# Patient Record
Sex: Female | Born: 1955 | Race: White | Hispanic: No | Marital: Married | Smoking: Former smoker
Health system: Southern US, Community
[De-identification: ages and names within clinical notes are randomized; demographics above are authoritative.]

## PROBLEM LIST (undated history)

## (undated) DIAGNOSIS — K219 Gastro-esophageal reflux disease without esophagitis: Secondary | ICD-10-CM

## (undated) DIAGNOSIS — N3281 Overactive bladder: Secondary | ICD-10-CM

## (undated) DIAGNOSIS — Z9889 Other specified postprocedural states: Secondary | ICD-10-CM

## (undated) DIAGNOSIS — R51 Headache: Secondary | ICD-10-CM

## (undated) DIAGNOSIS — M66239 Spontaneous rupture of extensor tendons, unspecified forearm: Secondary | ICD-10-CM

## (undated) DIAGNOSIS — M66249 Spontaneous rupture of extensor tendons, unspecified hand: Secondary | ICD-10-CM

## (undated) DIAGNOSIS — M1990 Unspecified osteoarthritis, unspecified site: Secondary | ICD-10-CM

## (undated) DIAGNOSIS — D649 Anemia, unspecified: Secondary | ICD-10-CM

## (undated) DIAGNOSIS — Z98811 Dental restoration status: Secondary | ICD-10-CM

## (undated) DIAGNOSIS — J302 Other seasonal allergic rhinitis: Secondary | ICD-10-CM

## (undated) DIAGNOSIS — Z8489 Family history of other specified conditions: Secondary | ICD-10-CM

## (undated) DIAGNOSIS — Z87828 Personal history of other (healed) physical injury and trauma: Secondary | ICD-10-CM

## (undated) DIAGNOSIS — R112 Nausea with vomiting, unspecified: Secondary | ICD-10-CM

## (undated) HISTORY — PX: LAPAROSCOPIC ENDOMETRIOSIS FULGURATION: SUR769

---

## 1992-03-04 HISTORY — PX: ABDOMINAL HYSTERECTOMY: SHX81

## 1997-06-23 ENCOUNTER — Other Ambulatory Visit: Admission: RE | Admit: 1997-06-23 | Discharge: 1997-06-23 | Payer: Self-pay | Admitting: Gynecology

## 1998-07-06 ENCOUNTER — Other Ambulatory Visit: Admission: RE | Admit: 1998-07-06 | Discharge: 1998-07-06 | Payer: Self-pay | Admitting: Gynecology

## 1999-08-06 ENCOUNTER — Other Ambulatory Visit: Admission: RE | Admit: 1999-08-06 | Discharge: 1999-08-06 | Payer: Self-pay | Admitting: Gynecology

## 2000-09-02 ENCOUNTER — Other Ambulatory Visit: Admission: RE | Admit: 2000-09-02 | Discharge: 2000-09-02 | Payer: Self-pay | Admitting: Gynecology

## 2001-02-09 ENCOUNTER — Encounter: Admission: RE | Admit: 2001-02-09 | Discharge: 2001-02-09 | Payer: Self-pay | Admitting: Urology

## 2001-02-09 ENCOUNTER — Encounter: Payer: Self-pay | Admitting: Urology

## 2001-10-01 ENCOUNTER — Other Ambulatory Visit: Admission: RE | Admit: 2001-10-01 | Discharge: 2001-10-01 | Payer: Self-pay | Admitting: Gynecology

## 2002-09-27 ENCOUNTER — Other Ambulatory Visit: Admission: RE | Admit: 2002-09-27 | Discharge: 2002-09-27 | Payer: Self-pay | Admitting: Gynecology

## 2003-10-03 ENCOUNTER — Other Ambulatory Visit: Admission: RE | Admit: 2003-10-03 | Discharge: 2003-10-03 | Payer: Self-pay | Admitting: Gynecology

## 2004-10-10 ENCOUNTER — Other Ambulatory Visit: Admission: RE | Admit: 2004-10-10 | Discharge: 2004-10-10 | Payer: Self-pay | Admitting: Gynecology

## 2005-05-24 ENCOUNTER — Ambulatory Visit (HOSPITAL_BASED_OUTPATIENT_CLINIC_OR_DEPARTMENT_OTHER): Admission: RE | Admit: 2005-05-24 | Discharge: 2005-05-24 | Payer: Self-pay | Admitting: Orthopedic Surgery

## 2005-05-24 HISTORY — PX: KNEE ARTHROSCOPY: SUR90

## 2005-11-28 ENCOUNTER — Other Ambulatory Visit: Admission: RE | Admit: 2005-11-28 | Discharge: 2005-11-28 | Payer: Self-pay | Admitting: Gynecology

## 2007-03-02 ENCOUNTER — Other Ambulatory Visit: Admission: RE | Admit: 2007-03-02 | Discharge: 2007-03-02 | Payer: Self-pay | Admitting: Gynecology

## 2008-01-11 ENCOUNTER — Encounter: Admission: RE | Admit: 2008-01-11 | Discharge: 2008-01-11 | Payer: Self-pay | Admitting: Family Medicine

## 2009-03-04 HISTORY — PX: COLONOSCOPY: SHX174

## 2010-02-01 HISTORY — PX: EYE SURGERY: SHX253

## 2010-03-08 ENCOUNTER — Other Ambulatory Visit
Admission: RE | Admit: 2010-03-08 | Discharge: 2010-03-08 | Payer: Self-pay | Source: Home / Self Care | Admitting: Family Medicine

## 2010-07-20 NOTE — Op Note (Signed)
Kaitlyn House, Kaitlyn House              ACCOUNT NO.:  [id number]   MEDICAL RECORD NO.:  [id number]          PATIENT TYPE:  AMB   LOCATION:  DSC                          FACILITY:  [facility]   PHYSICIAN:  Harvie Junior, M.D.   DATE OF BIRTH:  Sep 25, 1955   DATE OF PROCEDURE:  05/24/2005  DATE OF DISCHARGE:                                 OPERATIVE REPORT   PREOPERATIVE DIAGNOSIS:  Medial meniscal tear.   POSTOPERATIVE DIAGNOSES:  1.  Medial meniscal tear.  2.  Chondromalacia patella.   PRINCIPAL PROCEDURE:  1.  Partial posteromedial meniscectomy with corresponding debridement of      medial femoral condyle.  2.  Debridement of chondromalacia patella.   SURGEON:  Harvie Junior, M.D.   ASSISTANT:  Marshia Ly, P.A.   ANESTHESIA:  General.   BRIEF HISTORY:  [name] is a 55 year old female with a long history of  having had medial-side knee pain.  We ultimately evaluated her.  MRI was  obtained which showed no significant meniscal pathology.  She was further  evaluated and felt to have a meniscal tear.  Injection therapy worked  briefly, but pain recurred.  Because of continuing complaints of pain and  failure of conservative care, she was ultimately taken to the operating room  for operative knee arthroscopy.   DESCRIPTION OF PROCEDURE:  The patient was taken to the operating room and  after adequate anesthesia was obtained with an general anesthetic, the  patient was placed supine on the operating table.  The right leg was then  prepped and draped in the usual sterile fashion and following this, routine  arthroscopic examination revealed there were some grade 2 and 3  chondromalacia of the patellofemoral joint, which was debrided.  Attention  was then turned to the medial compartment, where there was an obvious  posterior horn medial meniscal tear at the meniscal root; this was debrided  back to a smooth and stable rim.  Attention was then turned towards the  medial femoral  condyle, which had some grade 2 and 3 changes, which were  debrided.  Attention was turned to the ACL, normal, lateral side normal.  Patellar tracking was noted to be normal.  At this point, the knee was  copiously irrigated and suctioned dry.  A followup check was made for any  loose fragments of the PCL and there were none.  A sterile  compressive dressing was applied and the patient was taken to the recovery  room, where she was noted to be in satisfactory condition.   ESTIMATED BLOOD LOSS:  None.      Harvie Junior, M.D.  Electronically Signed     JLG/MEDQ  D:  05/24/2005  T:  05/27/2005  Job:  [id number]

## 2011-03-15 ENCOUNTER — Encounter (HOSPITAL_COMMUNITY): Payer: Self-pay | Admitting: Pharmacy Technician

## 2011-03-25 ENCOUNTER — Encounter (HOSPITAL_COMMUNITY): Payer: Self-pay

## 2011-03-25 ENCOUNTER — Encounter (HOSPITAL_COMMUNITY)
Admission: RE | Admit: 2011-03-25 | Discharge: 2011-03-25 | Disposition: A | Discharge status: Home / Self Care | Payer: BC Managed Care – PPO | Source: Ambulatory Visit | Attending: Orthopedic Surgery | Admitting: Orthopedic Surgery

## 2011-03-25 ENCOUNTER — Other Ambulatory Visit: Payer: Self-pay | Admitting: Orthopedic Surgery

## 2011-03-25 HISTORY — DX: Nausea with vomiting, unspecified: R11.2

## 2011-03-25 HISTORY — DX: Other specified postprocedural states: Z98.890

## 2011-03-25 HISTORY — DX: Anemia, unspecified: D64.9

## 2011-03-25 HISTORY — DX: Nausea with vomiting, unspecified: Z98.890

## 2011-03-25 LAB — HEPATIC FUNCTION PANEL
ALT: 13 U/L (ref 0–35)
Alkaline Phosphatase: 75 U/L (ref 39–117)
Bilirubin, Direct: <0.1 mg/dL (ref 0.0–0.3)
Total Protein: 7.4 g/dL (ref 6.0–8.3)

## 2011-03-25 LAB — CBC
HCT: 37.9 % (ref 36.0–46.0)
MCH: 25.5 pg — ABNORMAL LOW (ref 26.0–34.0)
MCV: 81.2 fL (ref 78.0–100.0)
Platelets: 281 10*3/uL (ref 150–400)
RBC: 4.67 MIL/uL (ref 3.87–5.11)
WBC: 10.2 10*3/uL (ref 4.0–10.5)

## 2011-03-25 LAB — DIFFERENTIAL
Eosinophils Relative: 4 % (ref 0–5)
Lymphocytes Relative: 23 % (ref 12–46)
Lymphs Abs: 2.8 10*3/uL (ref 0.7–4.0)
Monocytes Absolute: 0.8 10*3/uL (ref 0.1–1.0)
Neutro Abs: 7.8 10*3/uL — ABNORMAL HIGH (ref 1.7–7.7)
Neutrophils Relative %: 66 % (ref 43–77)

## 2011-03-25 LAB — BASIC METABOLIC PANEL
Calcium: 9.8 mg/dL (ref 8.4–10.5)
GFR calc Af Amer: >90 mL/min (ref >90)
GFR calc non Af Amer: >90 mL/min (ref >90)
Glucose, Bld: 89 mg/dL (ref 70–99)
Potassium: 4.4 mEq/L (ref 3.5–5.1)
Sodium: 140 mEq/L (ref 135–145)

## 2011-03-25 LAB — TYPE AND SCREEN
ABO/RH(D): O POS
Antibody Screen: NEGATIVE

## 2011-03-25 LAB — PROTIME-INR: INR: 0.96 (ref 0.00–1.49)

## 2011-03-25 LAB — ABO/RH: ABO/RH(D): O POS

## 2011-03-25 MED ORDER — SCOPOLAMINE 1 MG/3DAYS TD PT72: 1.0000 | MEDICATED_PATCH | TRANSDERMAL | Status: DC

## 2011-03-25 NOTE — Pre-Procedure Instructions (Signed)
20 BARB SHEAR  03/25/2011   Your procedure is scheduled on:  Mar 29, 2011 (Friday)  Report to [facility] at 0530 AM.  Call this number if you have problems the morning of surgery: [phone]   Remember:   Do not eat food:After Midnight.  May have clear liquids: up to 4 Hours before arrival. (1:30 am)  Clear liquids include soda, tea, black coffee, apple or grape juice, broth.  Take these medicines the morning of surgery with A SIP OF WATER: vicodin & phenergan as needed, nexium   Do not wear jewelry, make-up or nail polish.  Do not wear lotions, powders, or perfumes. You may wear deodorant.  Do not shave 48 hours prior to surgery.  Do not bring valuables to the hospital.  Contacts, dentures or bridgework may not be worn into surgery.  Leave suitcase in the car. After surgery it may be brought to your room.  For patients admitted to the hospital, checkout time is 11:00 AM the day of discharge.   Patients discharged the day of surgery will not be allowed to drive home.  Name and phone number of your driver: NA  Special Instructions: CHG Shower Use Special Wash: 1/2 bottle night before surgery and 1/2 bottle morning of surgery.   Please read over the following fact sheets that you were given: Pain Booklet, Total Joint Packet and Surgical Site Infection Prevention

## 2011-03-28 MED ORDER — POVIDONE-IODINE 7.5 % EX SOLN
Freq: Once | CUTANEOUS | Status: DC
  Filled 2011-03-28: qty 118

## 2011-03-28 MED ORDER — CEFAZOLIN SODIUM-DEXTROSE 2-3 GM-% IV SOLR
2.0000 g | INTRAVENOUS | Status: AC
  Administered 2011-03-29: 2 g via INTRAVENOUS
  Filled 2011-03-28: qty 50

## 2011-03-29 ENCOUNTER — Encounter (HOSPITAL_COMMUNITY): Payer: Self-pay | Admitting: *Deleted

## 2011-03-29 ENCOUNTER — Other Ambulatory Visit: Payer: Self-pay

## 2011-03-29 ENCOUNTER — Encounter (HOSPITAL_COMMUNITY): Payer: Self-pay | Admitting: Anesthesiology

## 2011-03-29 ENCOUNTER — Ambulatory Visit (HOSPITAL_COMMUNITY): Payer: BC Managed Care – PPO | Admitting: Anesthesiology

## 2011-03-29 ENCOUNTER — Encounter (HOSPITAL_COMMUNITY)
Admission: RE | Disposition: A | Discharge status: Home / Self Care | Payer: Self-pay | Source: Ambulatory Visit | Attending: Orthopedic Surgery

## 2011-03-29 ENCOUNTER — Ambulatory Visit (HOSPITAL_COMMUNITY): Payer: BC Managed Care – PPO

## 2011-03-29 ENCOUNTER — Inpatient Hospital Stay (HOSPITAL_COMMUNITY)
Admission: RE | Admit: 2011-03-29 | Discharge: 2011-04-01 | DRG: 209 | Disposition: A | Discharge status: Home / Self Care | Payer: BC Managed Care – PPO | Source: Ambulatory Visit | Attending: Orthopedic Surgery | Admitting: Orthopedic Surgery

## 2011-03-29 DIAGNOSIS — M1712 Unilateral primary osteoarthritis, left knee: Secondary | ICD-10-CM | POA: Diagnosis present

## 2011-03-29 DIAGNOSIS — Z01812 Encounter for preprocedural laboratory examination: Secondary | ICD-10-CM

## 2011-03-29 DIAGNOSIS — M1710 Unilateral primary osteoarthritis, unspecified knee: Principal | ICD-10-CM | POA: Diagnosis present

## 2011-03-29 DIAGNOSIS — Z87891 Personal history of nicotine dependence: Secondary | ICD-10-CM

## 2011-03-29 DIAGNOSIS — R51 Headache: Secondary | ICD-10-CM | POA: Diagnosis present

## 2011-03-29 DIAGNOSIS — Z90710 Acquired absence of both cervix and uterus: Secondary | ICD-10-CM

## 2011-03-29 DIAGNOSIS — K219 Gastro-esophageal reflux disease without esophagitis: Secondary | ICD-10-CM | POA: Diagnosis present

## 2011-03-29 HISTORY — PX: TOTAL KNEE ARTHROPLASTY: SHX125

## 2011-03-29 LAB — URINALYSIS, ROUTINE W REFLEX MICROSCOPIC
Bilirubin Urine: NEGATIVE
Nitrite: NEGATIVE
Protein, ur: NEGATIVE mg/dL
Specific Gravity, Urine: 1.024 (ref 1.005–1.030)
Urobilinogen, UA: 0.2 mg/dL (ref 0.0–1.0)
pH: 5 (ref 5.0–8.0)

## 2011-03-29 LAB — URINE MICROSCOPIC-ADD ON

## 2011-03-29 SURGERY — ARTHROPLASTY, KNEE, TOTAL
Anesthesia: Regional | Site: Knee | Laterality: Left | Wound class: Clean

## 2011-03-29 MED ORDER — FESOTERODINE FUMARATE ER 4 MG PO TB24: 4.0000 mg | ORAL_TABLET | Freq: Every day | ORAL | Status: DC

## 2011-03-29 MED ORDER — KETOROLAC TROMETHAMINE 30 MG/ML IJ SOLN
30.0000 mg | Freq: Three times a day (TID) | INTRAMUSCULAR | Status: AC
  Administered 2011-03-29 – 2011-03-30 (×3): 30 mg via INTRAVENOUS
  Filled 2011-03-29 (×2): qty 1

## 2011-03-29 MED ORDER — ALUM & MAG HYDROXIDE-SIMETH 200-200-20 MG/5ML PO SUSP: 30.0000 mL | ORAL | Status: DC | PRN

## 2011-03-29 MED ORDER — DIPHENHYDRAMINE HCL 12.5 MG/5ML PO ELIX
12.5000 mg | ORAL_SOLUTION | Freq: Four times a day (QID) | ORAL | Status: DC | PRN
  Filled 2011-03-29 (×2): qty 5

## 2011-03-29 MED ORDER — PANTOPRAZOLE SODIUM 40 MG PO TBEC
40.0000 mg | DELAYED_RELEASE_TABLET | Freq: Every day | ORAL | Status: DC
  Administered 2011-03-29 – 2011-04-01 (×4): 40 mg via ORAL
  Filled 2011-03-29 (×4): qty 1

## 2011-03-29 MED ORDER — DOCUSATE SODIUM 100 MG PO CAPS
100.0000 mg | ORAL_CAPSULE | Freq: Two times a day (BID) | ORAL | Status: DC
  Administered 2011-03-29 – 2011-04-01 (×7): 100 mg via ORAL
  Filled 2011-03-29 (×7): qty 1

## 2011-03-29 MED ORDER — DEXTROSE-NACL 5-0.45 % IV SOLN
INTRAVENOUS | Status: DC
  Administered 2011-03-29: 15:00:00 via INTRAVENOUS

## 2011-03-29 MED ORDER — ONDANSETRON HCL 4 MG PO TABS: 4.0000 mg | ORAL_TABLET | Freq: Four times a day (QID) | ORAL | Status: DC | PRN

## 2011-03-29 MED ORDER — MORPHINE SULFATE (PF) 1 MG/ML IV SOLN: INTRAVENOUS | Status: DC

## 2011-03-29 MED ORDER — DIPHENHYDRAMINE HCL 50 MG/ML IJ SOLN: 12.5000 mg | Freq: Four times a day (QID) | INTRAMUSCULAR | Status: DC | PRN

## 2011-03-29 MED ORDER — ROCURONIUM BROMIDE 100 MG/10ML IV SOLN
INTRAVENOUS | Status: DC | PRN
  Administered 2011-03-29: 50 mg via INTRAVENOUS

## 2011-03-29 MED ORDER — CEFAZOLIN SODIUM-DEXTROSE 2-3 GM-% IV SOLR
2.0000 g | Freq: Four times a day (QID) | INTRAVENOUS | Status: AC
  Administered 2011-03-29 – 2011-03-30 (×3): 2 g via INTRAVENOUS
  Filled 2011-03-29 (×4): qty 50

## 2011-03-29 MED ORDER — SCOPOLAMINE 1 MG/3DAYS TD PT72
1.0000 | MEDICATED_PATCH | TRANSDERMAL | Status: AC
  Administered 2011-03-29: 1.5 mg via TRANSDERMAL
  Filled 2011-03-29: qty 1

## 2011-03-29 MED ORDER — CEFUROXIME SODIUM 1.5 G IJ SOLR
INTRAMUSCULAR | Status: DC | PRN
  Administered 2011-03-29: 1.5 g

## 2011-03-29 MED ORDER — LIDOCAINE HCL (CARDIAC) 20 MG/ML IV SOLN
INTRAVENOUS | Status: DC | PRN
  Administered 2011-03-29: 70 mg via INTRAVENOUS

## 2011-03-29 MED ORDER — ONDANSETRON HCL 4 MG/2ML IJ SOLN: 4.0000 mg | Freq: Four times a day (QID) | INTRAMUSCULAR | Status: DC | PRN

## 2011-03-29 MED ORDER — SODIUM CHLORIDE 0.9 % IJ SOLN: 9.0000 mL | INTRAMUSCULAR | Status: DC | PRN

## 2011-03-29 MED ORDER — ACETAMINOPHEN 10 MG/ML IV SOLN
1000.0000 mg | Freq: Four times a day (QID) | INTRAVENOUS | Status: AC
  Administered 2011-03-29 – 2011-03-30 (×3): 1000 mg via INTRAVENOUS
  Filled 2011-03-29 (×4): qty 100

## 2011-03-29 MED ORDER — FENTANYL CITRATE 0.05 MG/ML IJ SOLN
INTRAMUSCULAR | Status: DC | PRN
  Administered 2011-03-29: 100 ug via INTRAVENOUS
  Administered 2011-03-29: 50 ug via INTRAVENOUS

## 2011-03-29 MED ORDER — PSEUDOEPHEDRINE HCL 60 MG PO TABS
60.0000 mg | ORAL_TABLET | Freq: Every day | ORAL | Status: DC
  Administered 2011-03-30 – 2011-04-01 (×3): 60 mg via ORAL
  Filled 2011-03-29 (×4): qty 1

## 2011-03-29 MED ORDER — METHOCARBAMOL 100 MG/ML IJ SOLN
500.0000 mg | Freq: Four times a day (QID) | INTRAMUSCULAR | Status: DC | PRN
  Filled 2011-03-29: qty 5

## 2011-03-29 MED ORDER — NEOSTIGMINE METHYLSULFATE 1 MG/ML IJ SOLN
INTRAMUSCULAR | Status: DC | PRN
  Administered 2011-03-29: 4 mg via INTRAVENOUS

## 2011-03-29 MED ORDER — PROMETHAZINE HCL 25 MG PO TABS
25.0000 mg | ORAL_TABLET | ORAL | Status: DC | PRN
  Administered 2011-03-31 – 2011-04-01 (×4): 25 mg via ORAL
  Filled 2011-03-29 (×4): qty 1

## 2011-03-29 MED ORDER — PROPOFOL 10 MG/ML IV EMUL
INTRAVENOUS | Status: DC | PRN
  Administered 2011-03-29: 160 mg via INTRAVENOUS

## 2011-03-29 MED ORDER — PSEUDOEPHEDRINE-GUAIFENESIN ER 60-600 MG PO TB12: 1.0000 | ORAL_TABLET | Freq: Every day | ORAL | Status: DC

## 2011-03-29 MED ORDER — PHENYLEPHRINE HCL 10 MG/ML IJ SOLN
10.0000 mg | INTRAVENOUS | Status: DC | PRN
  Administered 2011-03-29: 10 ug/min via INTRAVENOUS

## 2011-03-29 MED ORDER — PATIENT'S GUIDE TO USING COUMADIN BOOK
Freq: Once | Status: AC
  Administered 2011-03-29: 15:00:00
  Filled 2011-03-29: qty 1

## 2011-03-29 MED ORDER — PROMETHAZINE HCL 25 MG/ML IJ SOLN
12.5000 mg | INTRAMUSCULAR | Status: DC | PRN
  Administered 2011-03-29 – 2011-03-31 (×2): 12.5 mg via INTRAVENOUS
  Filled 2011-03-29 (×2): qty 1

## 2011-03-29 MED ORDER — ACETAMINOPHEN 10 MG/ML IV SOLN
INTRAVENOUS | Status: DC | PRN
  Administered 2011-03-29: 1000 mg via INTRAVENOUS

## 2011-03-29 MED ORDER — SCOPOLAMINE 1 MG/3DAYS TD PT72
1.0000 | MEDICATED_PATCH | TRANSDERMAL | Status: DC
  Filled 2011-03-29: qty 1

## 2011-03-29 MED ORDER — MORPHINE SULFATE (PF) 1 MG/ML IV SOLN
INTRAVENOUS | Status: DC
  Administered 2011-03-29: 23:00:00 via INTRAVENOUS
  Administered 2011-03-29: 19.5 mg via INTRAVENOUS
  Administered 2011-03-30 (×2): 3 mg via INTRAVENOUS
  Administered 2011-03-30: 1.5 mg via INTRAVENOUS
  Administered 2011-03-30: 5.2 mg via INTRAVENOUS
  Administered 2011-03-30: 1.5 mg via INTRAVENOUS
  Administered 2011-03-31: 6 mg via INTRAVENOUS
  Administered 2011-03-31: via INTRAVENOUS
  Administered 2011-03-31: 1.5 mg via INTRAVENOUS
  Filled 2011-03-29 (×2): qty 25

## 2011-03-29 MED ORDER — ACETAMINOPHEN 10 MG/ML IV SOLN
INTRAVENOUS | Status: AC
  Filled 2011-03-29: qty 100

## 2011-03-29 MED ORDER — BUPIVACAINE-EPINEPHRINE PF 0.5-1:200000 % IJ SOLN
INTRAMUSCULAR | Status: DC | PRN
  Administered 2011-03-29: 30 mL

## 2011-03-29 MED ORDER — HYDROMORPHONE HCL PF 1 MG/ML IJ SOLN
0.2500 mg | INTRAMUSCULAR | Status: DC | PRN
  Administered 2011-03-29 (×2): 0.5 mg via INTRAVENOUS

## 2011-03-29 MED ORDER — MORPHINE SULFATE (PF) 1 MG/ML IV SOLN
INTRAVENOUS | Status: DC
  Administered 2011-03-29: 11:00:00 via INTRAVENOUS
  Filled 2011-03-29: qty 25

## 2011-03-29 MED ORDER — METHOCARBAMOL 500 MG PO TABS
500.0000 mg | ORAL_TABLET | Freq: Four times a day (QID) | ORAL | Status: DC | PRN
  Administered 2011-03-30 – 2011-03-31 (×2): 500 mg via ORAL
  Filled 2011-03-29 (×2): qty 1

## 2011-03-29 MED ORDER — ACETAMINOPHEN 650 MG RE SUPP: 650.0000 mg | Freq: Four times a day (QID) | RECTAL | Status: DC | PRN

## 2011-03-29 MED ORDER — MIDAZOLAM HCL 5 MG/5ML IJ SOLN
INTRAMUSCULAR | Status: DC | PRN
  Administered 2011-03-29 (×2): 1 mg via INTRAVENOUS

## 2011-03-29 MED ORDER — OXYCODONE HCL 5 MG PO TABS
5.0000 mg | ORAL_TABLET | ORAL | Status: DC | PRN
  Administered 2011-03-31 – 2011-04-01 (×7): 10 mg via ORAL
  Filled 2011-03-29 (×7): qty 2

## 2011-03-29 MED ORDER — ACETAMINOPHEN 325 MG PO TABS: 650.0000 mg | ORAL_TABLET | Freq: Four times a day (QID) | ORAL | Status: DC | PRN

## 2011-03-29 MED ORDER — GLYCOPYRROLATE 0.2 MG/ML IJ SOLN
INTRAMUSCULAR | Status: DC | PRN
  Administered 2011-03-29: .6 mg via INTRAVENOUS

## 2011-03-29 MED ORDER — ONDANSETRON HCL 4 MG/2ML IJ SOLN
INTRAMUSCULAR | Status: DC | PRN
  Administered 2011-03-29 (×2): 4 mg via INTRAVENOUS

## 2011-03-29 MED ORDER — ZOLPIDEM TARTRATE 5 MG PO TABS: 5.0000 mg | ORAL_TABLET | Freq: Every evening | ORAL | Status: DC | PRN

## 2011-03-29 MED ORDER — METHOCARBAMOL 100 MG/ML IJ SOLN
500.0000 mg | INTRAVENOUS | Status: AC
  Administered 2011-03-29: 500 mg via INTRAVENOUS
  Filled 2011-03-29: qty 5

## 2011-03-29 MED ORDER — KETOROLAC TROMETHAMINE 30 MG/ML IJ SOLN
INTRAMUSCULAR | Status: AC
  Administered 2011-03-30: 30 mg via INTRAVENOUS
  Filled 2011-03-29: qty 1

## 2011-03-29 MED ORDER — POLYSACCHARIDE IRON 150 MG PO CAPS
150.0000 mg | ORAL_CAPSULE | Freq: Every day | ORAL | Status: DC
  Administered 2011-03-29 – 2011-03-31 (×3): 150 mg via ORAL
  Filled 2011-03-29 (×4): qty 1

## 2011-03-29 MED ORDER — DEXAMETHASONE SODIUM PHOSPHATE 4 MG/ML IJ SOLN
INTRAMUSCULAR | Status: DC | PRN
  Administered 2011-03-29: 4 mg via INTRAVENOUS

## 2011-03-29 MED ORDER — FESOTERODINE FUMARATE ER 4 MG PO TB24
4.0000 mg | ORAL_TABLET | Freq: Every day | ORAL | Status: DC
  Administered 2011-03-29 – 2011-03-31 (×3): 4 mg via ORAL
  Filled 2011-03-29 (×4): qty 1

## 2011-03-29 MED ORDER — WARFARIN SODIUM 5 MG PO TABS
5.0000 mg | ORAL_TABLET | Freq: Once | ORAL | Status: AC
  Administered 2011-03-29: 5 mg via ORAL
  Filled 2011-03-29: qty 1

## 2011-03-29 MED ORDER — WARFARIN VIDEO: Freq: Once | Status: DC

## 2011-03-29 MED ORDER — LACTATED RINGERS IV SOLN
INTRAVENOUS | Status: DC | PRN
  Administered 2011-03-29 (×2): via INTRAVENOUS

## 2011-03-29 MED ORDER — NALOXONE HCL 0.4 MG/ML IJ SOLN: 0.4000 mg | INTRAMUSCULAR | Status: DC | PRN

## 2011-03-29 MED ORDER — GUAIFENESIN ER 600 MG PO TB12
600.0000 mg | ORAL_TABLET | Freq: Every day | ORAL | Status: DC
  Administered 2011-03-30 – 2011-04-01 (×3): 600 mg via ORAL
  Filled 2011-03-29 (×4): qty 1

## 2011-03-29 MED ORDER — PROPOFOL 10 MG/ML IV EMUL
INTRAVENOUS | Status: DC | PRN
  Administered 2011-03-29: 10:00:00 via INTRAVENOUS
  Administered 2011-03-29: 140 ug/kg/min via INTRAVENOUS
  Administered 2011-03-29: 09:00:00 via INTRAVENOUS

## 2011-03-29 MED ORDER — SODIUM CHLORIDE 0.9 % IR SOLN
Status: DC | PRN
  Administered 2011-03-29: 1000 mL

## 2011-03-29 SURGICAL SUPPLY — 72 items
BANDAGE ESMARK 6X9 LF (GAUZE/BANDAGES/DRESSINGS) ×1 IMPLANT
BENZOIN TINCTURE PRP APPL 2/3 (GAUZE/BANDAGES/DRESSINGS) IMPLANT
BLADE SAGITTAL 25.0X1.19X90 (BLADE) ×2 IMPLANT
BLADE SAW SAG 90X13X1.27 (BLADE) ×2 IMPLANT
BNDG ESMARK 6X9 LF (GAUZE/BANDAGES/DRESSINGS) ×2
BOWL SMART MIX CTS (DISPOSABLE) ×2 IMPLANT
CEMENT HV SMART SET (Cement) ×4 IMPLANT
CLOSURE STERI STRIP 1/2 X4 (GAUZE/BANDAGES/DRESSINGS) ×2 IMPLANT
CLOTH BEACON ORANGE TIMEOUT ST (SAFETY) ×2 IMPLANT
COVER BACK TABLE 24X17X13 BIG (DRAPES) IMPLANT
COVER SURGICAL LIGHT HANDLE (MISCELLANEOUS) ×2 IMPLANT
CUFF TOURNIQUET SINGLE 34IN LL (TOURNIQUET CUFF) ×2 IMPLANT
CUFF TOURNIQUET SINGLE 44IN (TOURNIQUET CUFF) IMPLANT
DRAPE EXTREMITY T 121X128X90 (DRAPE) ×2 IMPLANT
DRAPE U-SHAPE 47X51 STRL (DRAPES) ×2 IMPLANT
DRSG PAD ABDOMINAL 8X10 ST (GAUZE/BANDAGES/DRESSINGS) ×2 IMPLANT
DURAPREP 26ML APPLICATOR (WOUND CARE) ×2 IMPLANT
ELECT REM PT RETURN 9FT ADLT (ELECTROSURGICAL) ×2
ELECTRODE REM PT RTRN 9FT ADLT (ELECTROSURGICAL) ×1 IMPLANT
EVACUATOR 1/8 PVC DRAIN (DRAIN) ×2 IMPLANT
FACESHIELD LNG OPTICON STERILE (SAFETY) ×2 IMPLANT
GAUZE XEROFORM 1X8 LF (GAUZE/BANDAGES/DRESSINGS) ×2 IMPLANT
GAUZE XEROFORM 5X9 LF (GAUZE/BANDAGES/DRESSINGS) IMPLANT
GLOVE BIOGEL PI IND STRL 7.0 (GLOVE) ×1 IMPLANT
GLOVE BIOGEL PI IND STRL 7.5 (GLOVE) ×1 IMPLANT
GLOVE BIOGEL PI IND STRL 8 (GLOVE) ×2 IMPLANT
GLOVE BIOGEL PI INDICATOR 7.0 (GLOVE) ×1
GLOVE BIOGEL PI INDICATOR 7.5 (GLOVE) ×1
GLOVE BIOGEL PI INDICATOR 8 (GLOVE) ×2
GLOVE ECLIPSE 7.5 STRL STRAW (GLOVE) ×6 IMPLANT
GLOVE SURG ORTHO 7.0 STRL STRW (GLOVE) ×2 IMPLANT
GLOVE SURG SS PI 6.5 STRL IVOR (GLOVE) ×2 IMPLANT
GOWN PREVENTION PLUS XLARGE (GOWN DISPOSABLE) ×4 IMPLANT
GOWN SRG XL XLNG 56XLVL 4 (GOWN DISPOSABLE) ×1 IMPLANT
GOWN STRL NON-REIN LRG LVL3 (GOWN DISPOSABLE) ×4 IMPLANT
GOWN STRL NON-REIN XL XLG LVL4 (GOWN DISPOSABLE) ×1
HANDPIECE INTERPULSE COAX TIP (DISPOSABLE) ×1
HOOD PEEL AWAY FACE SHEILD DIS (HOOD) ×6 IMPLANT
IMMOBILIZER KNEE 20 (SOFTGOODS)
IMMOBILIZER KNEE 20 THIGH 36 (SOFTGOODS) IMPLANT
IMMOBILIZER KNEE 22 UNIV (SOFTGOODS) ×2 IMPLANT
IMMOBILIZER KNEE 24 THIGH 36 (MISCELLANEOUS) IMPLANT
IMMOBILIZER KNEE 24 UNIV (MISCELLANEOUS)
KIT BASIN OR (CUSTOM PROCEDURE TRAY) ×2 IMPLANT
KIT ROOM TURNOVER OR (KITS) ×2 IMPLANT
MANIFOLD NEPTUNE II (INSTRUMENTS) ×2 IMPLANT
MARKER SPHERE PSV REFLC THRD 5 (MARKER) ×6 IMPLANT
NEEDLE HYPO 25GX1X1/2 BEV (NEEDLE) IMPLANT
NS IRRIG 1000ML POUR BTL (IV SOLUTION) ×2 IMPLANT
PACK TOTAL JOINT (CUSTOM PROCEDURE TRAY) ×2 IMPLANT
PAD ARMBOARD 7.5X6 YLW CONV (MISCELLANEOUS) ×2 IMPLANT
PAD CAST 4YDX4 CTTN HI CHSV (CAST SUPPLIES) IMPLANT
PADDING CAST COTTON 4X4 STRL (CAST SUPPLIES)
PADDING CAST COTTON 6X4 STRL (CAST SUPPLIES) ×2 IMPLANT
PIN SCHANZ 4MM 130MM (PIN) ×8 IMPLANT
SET HNDPC FAN SPRY TIP SCT (DISPOSABLE) ×1 IMPLANT
SPONGE GAUZE 4X4 12PLY (GAUZE/BANDAGES/DRESSINGS) IMPLANT
STAPLER VISISTAT 35W (STAPLE) ×2 IMPLANT
STRIP CLOSURE SKIN 1/2X4 (GAUZE/BANDAGES/DRESSINGS) IMPLANT
SUCTION FRAZIER TIP 10 FR DISP (SUCTIONS) ×2 IMPLANT
SUT ETHIBOND 2 OS 4 DA (SUTURE) ×2 IMPLANT
SUT MON AB 3-0 SH 27 (SUTURE) ×3
SUT MON AB 3-0 SH27 (SUTURE) ×3 IMPLANT
SUT VIC AB 0 CTB1 27 (SUTURE) ×4 IMPLANT
SUT VIC AB 1 CT1 27 (SUTURE) ×4
SUT VIC AB 1 CT1 27XBRD ANBCTR (SUTURE) ×4 IMPLANT
SUT VIC AB 2-0 CTB1 (SUTURE) ×4 IMPLANT
SYR CONTROL 10ML LL (SYRINGE) IMPLANT
TOWEL OR 17X24 6PK STRL BLUE (TOWEL DISPOSABLE) ×2 IMPLANT
TOWEL OR 17X26 10 PK STRL BLUE (TOWEL DISPOSABLE) ×2 IMPLANT
TRAY FOLEY CATH 14FR (SET/KITS/TRAYS/PACK) ×2 IMPLANT
WATER STERILE IRR 1000ML POUR (IV SOLUTION) ×4 IMPLANT

## 2011-03-29 NOTE — Anesthesia Procedure Notes (Addendum)
Anesthesia Regional Block:  Femoral nerve block  Pre-Anesthetic Checklist: ,, timeout performed, Correct Patient, Correct Site, Correct Laterality, Correct Procedure,, site marked, risks and benefits discussed, Surgical consent,  Pre-op evaluation,  At surgeon's request and post-op pain management  Laterality: Left  Prep: chloraprep       Needles:  Injection technique: Single-shot  Needle Type: Echogenic Stimulator Needle     Needle Length: 9cm  Needle Gauge: 21    Additional Needles:  Procedures: nerve stimulator Femoral nerve block  Nerve Stimulator or Paresthesia:  Response: Quadriceps muscle contraction, 0.45 mA,   Additional Responses:   Narrative:  Start time: 03/29/2011 7:10 AM End time: 03/29/2011 7:20 AM Injection made incrementally with aspirations every 5 mL.  Performed by: Personally  Anesthesiologist: Dr Chaney Malling  Additional Notes: Functioning IV was confirmed and monitors were applied.  A 90mm 21ga Arrow echogenic stimulator needle was used. Sterile prep and drape,hand hygiene and sterile gloves were used.  Negative aspiration and negative test dose prior to incremental administration of local anesthetic. The patient tolerated the procedure well.    Femoral nerve block Procedure Name: Intubation Date/Time: 03/29/2011 8:49 AM Performed by: Darcey Nora Pre-anesthesia Checklist: Patient identified, Emergency Drugs available, Suction available and Patient being monitored Patient Re-evaluated:Patient Re-evaluated prior to inductionOxygen Delivery Method: Circle System Utilized Preoxygenation: Pre-oxygenation with 100% oxygen Intubation Type: IV induction Ventilation: Mask ventilation without difficulty Laryngoscope Size: Mac and 3 Grade View: Grade II Tube type: Oral Tube size: 7.5 mm Number of attempts: 1 Airway Equipment and Method: stylet Secured at: 22 cm Tube secured with: Tape Dental Injury: Teeth and Oropharynx as per pre-operative assessment

## 2011-03-29 NOTE — Progress Notes (Signed)
Called Dr. Jodi Geralds to report that Patient says her surgery should be on the left knee instead of the right. Dr. Luiz Blare stated to change it to the left knee.

## 2011-03-29 NOTE — Transfer of Care (Signed)
Immediate Anesthesia Transfer of Care Note  Patient: Kaitlyn House  Procedure(s) Performed:  TOTAL KNEE ARTHROPLASTY  Patient Location: PACU  Anesthesia Type: General  Level of Consciousness: awake, sedated and patient cooperative  Airway & Oxygen Therapy: Patient Spontanous Breathing and Patient connected to nasal cannula oxygen  Post-op Assessment: Report given to PACU RN, Post -op Vital signs reviewed and stable and Patient moving all extremities  Post vital signs: Reviewed and stable  Complications: No apparent anesthesia complications

## 2011-03-29 NOTE — Brief Op Note (Cosign Needed)
03/29/2011  10:26 AM  PATIENT:  Kaitlyn House  56 y.o. female  PRE-OPERATIVE DIAGNOSIS:  Degenerative Joint Disease Left Knee  POST-OPERATIVE DIAGNOSIS:  Degenerative Joint Disease Left Knee  PROCEDURE:  Procedure(s):Left TOTAL KNEE ARTHROPLASTY Computer Assisted  SURGEON:  Surgeon(s): Harvie Junior, MD  PHYSICIAN ASSISTANT: Susanna Benge PA-C  ANESTHESIA:   general  EBL:  Total I/O In: 1000 [I.V.:1000] Out: 600 [Urine:475; Blood:125]  BLOOD ADMINISTERED:none  DRAINS: Hemovac x 1  COUNTS:  YES  TOURNIQUET:   Total Tourniquet Time Documented: Thigh (Left) - 85 minutes  DICTATION: .#[id number]  PLAN OF CARE: Admit to inpatient   PATIENT DISPOSITION:  PACU - hemodynamically stable.   Gus Puma PA-C

## 2011-03-29 NOTE — Anesthesia Postprocedure Evaluation (Signed)
Anesthesia Post Note  Patient: Kaitlyn House  Procedure(s) Performed:  TOTAL KNEE ARTHROPLASTY  Anesthesia type: General  Patient location: PACU  Post pain: Pain level controlled and Adequate analgesia  Post assessment: Post-op Vital signs reviewed, Patient's Cardiovascular Status Stable, Respiratory Function Stable, Patent Airway and Pain level controlled  Last Vitals:  Filed Vitals:   03/29/11 1119  BP:   Pulse:   Temp:   Resp: 17    Post vital signs: Reviewed and stable  Level of consciousness: awake, alert  and oriented  Complications: No apparent anesthesia complications

## 2011-03-29 NOTE — Preoperative (Signed)
Beta Blockers   Reason not to administer Beta Blockers:Not Applicable 

## 2011-03-29 NOTE — Op Note (Signed)
Kaitlyn House, Kaitlyn House              ACCOUNT NO.:  [id number]  MEDICAL RECORD NO.:  [id number]  LOCATION:  [facility]                         FACILITY:  [facility]  PHYSICIAN:  Harvie Junior, M.D.   DATE OF BIRTH:  08/20/1955  DATE OF PROCEDURE:  03/29/2011 DATE OF DISCHARGE:                              OPERATIVE REPORT   PREOPERATIVE DIAGNOSIS:  End-stage degenerative joint disease, bilateral knee.  POSTOPERATIVE DIAGNOSIS:  End-stage degenerative joint disease, bilateral knee.  PROCEDURE: 1. Left total knee replacement with a Sigma system, size 4 narrow     femur, size 3 MBT revision tray, 15 mm bridging bearing, and a 35     mm all-polyethylene patella. 2. Computer-assisted left total knee replacement.  SURGEON:  Harvie Junior, MD  ASSISTANT:  Marshia Ly, PA  ANESTHESIA:  General.  BRIEF HISTORY:  Kaitlyn House is a 56 year old female with a long history of severe bilateral degenerative joint disease of the knee, which was treated conservatively for a prolonged period of time with activity modification, injection, therapy, anti-inflammatory medication, and use of a cane.  She has failed all of these.  She had bone-on-bone changes on her x-ray.  We talked about treatment options.  I also felt that a total knee replacement is most appropriate one.  Because of her young age and large size, I felt the computer assistance will be appropriate and also felt that an MBT revision tray would be appropriate given being a female.  The patient was taken to the operating room for this procedure.  PROCEDURE IN DETAIL:  The patient was taken to the operating room. After adequate level of anesthesia was obtained with general anesthetic, the patient was placed supine on the operating table.  The left leg was prepped and draped in the usual sterile fashion.  Following this, the leg was exsanguinated and blood pressure tourniquet was inflated to 350 mmHg.  Following this, a midline incision was  made in the subcutaneous tissue down to the level of extensor mechanism.  A medial parapatellar arthrotomy was undertaken.  Following this, the knee was exposed, medial and lateral meniscus were removed as well as retropatellar fat pad, anterior and posterior cruciates, and synovium in the anterior aspect of the femur.  Following this, the computer modules were placed, 2 pins in the tibia, 2 pins in the femur, and the registration process was undertaken.  This has 30 minutes of surgical procedure.  Once this was completed, attention then turned to the tibia, which was cut perpendicular to its long axis.  Attention then turned to the femur, cut perpendicular to the anatomic axis.  Spacer blocks were put in place. At this point, 15 appeared to be the gap and she had perfect neutral long alignment at this point.  Attention then turned to the femur, sized to a 4.  Anterior and posterior cuts were made as well as Chamfers and box.  Attention then turned to the tibia, sized to a 3.  MBT revision tray was chosen because of her large size.  Once the femoral trial was put in place, we went to a 4 narrow femur, she has had a better fit and went to a 15-mm  poly trial and this gave by computer assistance perfect neutral long alignment and gap balance.  Attention then turned to the patella, cut down to the level of 13 mm and a 35 paddle was chosen and then the lugs were drilled for the patella and lugs were drilled in the femur.  Attention then turned back to the knee where the trial components were removed and the final components were cemented into place.  Size 4 narrow femur, size 3, and MBT revision tray and this was after the femur was thoroughly washed and dried.  The 50 mm bridging bearing was placed trial and then the 35 all-poly patella and all cement was allowed to harden.  All excess bone cement was removed.  Once this was completed, the tourniquet was let down.  We trial a 12.5-poly,  which appeared to be a little bit too loose in extension and to loose in flexion, so we went back to the 15 and put the final 15 in.  A medium Hemovac drain was placed.  During the procedure, it did appear that the popliteal tendon was ruptured at some point and this was repaired with a #2 Ethibond interrupted suture.  The final 15 poly was put in place, medium Hemovac drain placed, and the medial parapatellar arthrotomy was closed with a 1 Vicryl running.  All bleedings were controlled with electrocautery prior to this.  At this point, the skin was closed with 0 and 2-0 Vicryl and 3-0 Monocryl subcuticular.  Benzoin and Steri-Strips were applied.  Sterile compressive dressing was applied.  The patient was taken to the recovery room where she was noted to be in satisfactory condition.  Estimated blood loss for the procedure was none.     Harvie Junior, M.D.     Ranae Plumber  D:  03/29/2011  T:  03/29/2011  Job:  [id number]

## 2011-03-29 NOTE — Anesthesia Preprocedure Evaluation (Addendum)
Anesthesia Evaluation  Patient identified by MRN, date of birth, ID band Patient awake    Reviewed: Allergy & Precautions, H&P , NPO status , Patient's Chart, lab work & pertinent test results, reviewed documented beta blocker date and time   History of Anesthesia Complications (+) PONV and POST - OP SPINAL HEADACHE  Airway Mallampati: II TM Distance: >3 FB Neck ROM: full    Dental  (+) Teeth Intact and Dental Advisory Given   Pulmonary          Cardiovascular     Neuro/Psych  Headaches,    GI/Hepatic GERD-  Medicated and Controlled,  Endo/Other  Morbid obesity  Renal/GU      Musculoskeletal   Abdominal   Peds  Hematology   Anesthesia Other Findings   Reproductive/Obstetrics                         Anesthesia Physical Anesthesia Plan  ASA: II  Anesthesia Plan: General   Post-op Pain Management: MAC Combined w/ Regional for Post-op pain   Induction: Intravenous  Airway Management Planned: Oral ETT  Additional Equipment:   Intra-op Plan:   Post-operative Plan: Extubation in OR  Informed Consent: I have reviewed the patients History and Physical, chart, labs and discussed the procedure including the risks, benefits and alternatives for the proposed anesthesia with the patient or authorized representative who has indicated his/her understanding and acceptance.     Plan Discussed with: CRNA and Surgeon  Anesthesia Plan Comments:         Anesthesia Quick Evaluation

## 2011-03-29 NOTE — Progress Notes (Signed)
ANTICOAGULATION CONSULT NOTE - Initial Consult  Pharmacy Consult for warfarin Indication: VTE prophylaxis  No Known Allergies  Patient Measurements:   Heparin Dosing Weight:   Vital Signs: Temp: 97.9 F (36.6 C) (01/25 1232) Temp src: Oral (01/25 0613) BP: 108/53 mmHg (01/25 1245) Pulse Rate: 63  (01/25 1232)  Labs: No results found for this basename: HGB:2,HCT:3,PLT:3,APTT:3,LABPROT:3,INR:3,HEPARINUNFRC:3,CREATININE:3,CKTOTAL:3,CKMB:3,TROPONINI:3 in the last 72 hours CrCl is unknown because there is no height on file for the current visit.  Medical History: Past Medical History  Diagnosis Date  . PONV (postoperative nausea and vomiting)   . Spinal headache   . Anemia     tx with iron    Medications:  Scheduled:    .  ceFAZolin (ANCEF) IV  2 g Intravenous 60 min Pre-Op  . ketorolac  30 mg Intravenous Q8H  . ketorolac      . methocarbamol(ROBAXIN) IV  500 mg Intravenous To PACU  . scopolamine  1 patch Transdermal To 282  . DISCONTD: morphine   Intravenous Q4H  . DISCONTD: povidone-iodine   Topical Once   Infusions:    Assessment: 56 yo female s/p total knee arthroplasty will be started on coumadin therapy.  INR on 03/25/11 was 0.96.  Coumadin score = 5 Goal of Therapy:  INR 1.5-2   Plan:  1) Coumadin 5mg  po x1 2) INR in am  Jacqualyn Sedgwick, Tsz-Yin 03/29/2011,1:37 PM

## 2011-03-29 NOTE — H&P (Signed)
PREOPERATIVE H&P  Chief Complaint: bilat kneee pain  HPI: Kaitlyn House is a 56 y.o. female who presents for evaluation of bilat knee pain L>R. It has been present for greater than 1 year and has been worsening.  She has bone on bone changes on x-rayr She has failed conservative measures. Pain is rated as severe.  Past Medical History  Diagnosis Date  . PONV (postoperative nausea and vomiting)   . Spinal headache   . Anemia     tx with iron   Past Surgical History  Procedure Date  . Abdominal hysterectomy 1994  . Knee arthroscopy   . Eye surgery 02/2010    lasik  bilateral  . Colonoscopy 2011  . Laparoscopic endometriosis fulguration    History   Social History  . Marital Status: Married    Spouse Name: N/A    Number of Children: N/A  . Years of Education: N/A   Social History Main Topics  . Smoking status: Former Smoker    Quit date: 03/04/1988  . Smokeless tobacco: None  . Alcohol Use: Yes  . Drug Use: Yes    Special: Hydrocodone  . Sexually Active:    Other Topics Concern  . None   Social History Narrative  . None   History reviewed. No pertinent family history. No Known Allergies Prior to Admission medications   Medication Sig Start Date End Date Taking? Authorizing Provider  Ascorbic Acid (VITAMIN C) 1000 MG tablet Take 1,000 mg by mouth at bedtime.   Yes Historical Provider, MD  aspirin-acetaminophen-caffeine (EXCEDRIN MIGRAINE) [phone] MG per tablet Take 2 tablets by mouth every 6 (six) hours as needed. migraines   Yes Historical Provider, MD  Calcium Carbonate-Vit D-Min (CALCIUM 600+D PLUS MINERALS) 600-400 MG-UNIT TABS Take 2 tablets by mouth every morning.   Yes Historical Provider, MD  diphenhydrAMINE (BENADRYL) 25 MG tablet Take 25-50 mg by mouth at bedtime as needed. Congestion   Yes Historical Provider, MD  esomeprazole (NEXIUM) 20 MG capsule Take 20 mg by mouth daily as needed. Acid reflux   Yes Historical Provider, MD  fesoterodine (TOVIAZ)  4 MG TB24 Take 4 mg by mouth at bedtime.   Yes Historical Provider, MD  HYDROcodone-acetaminophen (VICODIN) 5-500 MG per tablet Take 1 tablet by mouth every 6 (six) hours as needed. For pain   Yes Historical Provider, MD  ibuprofen (ADVIL,MOTRIN) 200 MG tablet Take 400-600 mg by mouth every 6 (six) hours as needed. For pain   Yes Historical Provider, MD  polysaccharide iron (NIFEREX) 150 MG CAPS capsule Take 150 mg by mouth at bedtime.   Yes Historical Provider, MD  promethazine (PHENERGAN) 25 MG tablet Take 25 mg by mouth every 6 (six) hours as needed. For nausea with hydrocodone   Yes Historical Provider, MD  pseudoephedrine-guaifenesin (MUCINEX D) 60-600 MG per tablet Take 1 tablet by mouth daily after breakfast. Congestion   Yes Historical Provider, MD  scopolamine (TRANSDERM-SCOP) 1.5 MG Place 1 patch (1.5 mg total) onto the skin every 3 (three) days. 03/25/11 03/24/12  Harvie Junior, MD     Positive ROS: none  All other systems have been reviewed and were otherwise negative with the exception of those mentioned in the HPI and as above.  Physical Exam: Filed Vitals:   03/29/11 0613  BP: 138/91  Pulse: 80  Temp: 98 F (36.7 C)  Resp: 20    General: Alert, no acute distress Cardiovascular: No pedal edema Respiratory: No cyanosis, no use of accessory musculature  GI: No organomegaly, abdomen is soft and non-tender Skin: No lesions in the area of chief complaint Neurologic: Sensation intact distally Psychiatric: Patient is competent for consent with normal mood and affect Lymphatic: No axillary or cervical lymphadenopathy  MUSCULOSKELETAL: l. Knee pain on rom.  +ttp over med side skin intact.    Assessment/Plan: degenerative joint disease Plan for Procedure(s): TOTAL KNEE ARTHROPLASTY Computer assisted knee arthroplasty  The risks benefits and alternatives were discussed with the patient including but not limited to the risks of nonoperative treatment, versus surgical  intervention including infection, bleeding, nerve injury, malunion, nonunion, hardware prominence, hardware failure, need for hardware removal, blood clots, cardiopulmonary complications, morbidity, mortality, among others, and they were willing to proceed.  Predicted outcome is good, although there will be at least a six to nine month expected recovery.  Kaitlyn House L, MD 03/29/2011 7:24 AM

## 2011-03-30 LAB — CBC
HCT: 29.9 % — ABNORMAL LOW (ref 36.0–46.0)
MCH: 26.5 pg (ref 26.0–34.0)
MCV: 81.7 fL (ref 78.0–100.0)
Platelets: 245 10*3/uL (ref 150–400)
RBC: 3.66 MIL/uL — ABNORMAL LOW (ref 3.87–5.11)

## 2011-03-30 LAB — BASIC METABOLIC PANEL
GFR calc Af Amer: >90 mL/min (ref >90)
GFR calc non Af Amer: >90 mL/min (ref >90)
Potassium: 4.3 mEq/L (ref 3.5–5.1)
Sodium: 138 mEq/L (ref 135–145)

## 2011-03-30 MED ORDER — WARFARIN SODIUM 5 MG PO TABS
5.0000 mg | ORAL_TABLET | Freq: Once | ORAL | Status: AC
  Administered 2011-03-30: 5 mg via ORAL
  Filled 2011-03-30: qty 1

## 2011-03-30 NOTE — Evaluation (Signed)
Physical Therapy Evaluation Patient Details Name: Kaitlyn House MRN: [id number] DOB: November 29, 1955 Today's Date: 03/30/2011  Problem List:  Patient Active Problem List  Diagnoses  . Osteoarthritis of left knee    Past Medical History:  Past Medical History  Diagnosis Date  . PONV (postoperative nausea and vomiting)   . Spinal headache   . Anemia     tx with iron   Past Surgical History:  Past Surgical History  Procedure Date  . Abdominal hysterectomy 1994  . Knee arthroscopy   . Eye surgery 02/2010    lasik  bilateral  . Colonoscopy 2011  . Laparoscopic endometriosis fulguration     PT Assessment/Plan/Recommendation PT Assessment Clinical Impression Statement: Pt underwent L TKA 03-29-11.  She was able to ambulate in the hallway today, POD #1.  She will benefit from PT to increase mobility, provide education, and increase independence for safe d/c home. PT Recommendation/Assessment: Patient will need skilled PT in the acute care venue PT Problem List: Decreased activity tolerance;Decreased mobility;Decreased knowledge of use of DME;Decreased knowledge of precautions;Pain Barriers to Discharge: None PT Therapy Diagnosis : Difficulty walking;Acute pain PT Plan PT Frequency: 7X/week PT Treatment/Interventions: DME instruction;Gait training;Stair training;Functional mobility training;Therapeutic activities;Therapeutic exercise;Patient/family education PT Recommendation Recommendations for Other Services: OT consult Follow Up Recommendations: Home health PT Equipment Recommended: Rolling walker with 5" wheels PT Goals  Acute Rehab PT Goals PT Goal Formulation: With patient Time For Goal Achievement: 7 days Pt will go Supine/Side to Sit: with modified independence PT Goal: Supine/Side to Sit - Progress: Goal set today Pt will go Sit to Supine/Side: with modified independence PT Goal: Sit to Supine/Side - Progress: Goal set today Pt will go Sit to Stand: with modified  independence PT Goal: Sit to Stand - Progress: Goal set today Pt will go Stand to Sit: with modified independence PT Goal: Stand to Sit - Progress: Goal set today Pt will Transfer Bed to Chair/Chair to Bed: with modified independence PT Transfer Goal: Bed to Chair/Chair to Bed - Progress: Goal set today Pt will Ambulate: >150 feet;with modified independence;with rolling walker PT Goal: Ambulate - Progress: Goal set today Pt will Go Up / Down Stairs: 3-5 stairs;with least restrictive assistive device;with min assist PT Goal: Up/Down Stairs - Progress: Goal set today Pt will Perform Home Exercise Program: Independently PT Goal: Perform Home Exercise Program - Progress: Goal set today  PT Evaluation Precautions/Restrictions  Precautions Precautions: Knee Required Braces or Orthoses: Yes Knee Immobilizer: On except when in CPM Restrictions Weight Bearing Restrictions: Yes LLE Weight Bearing: Weight bearing as tolerated Prior Functioning  Home Living Lives With: Spouse;Daughter Receives Help From: Family Type of Home: House Home Layout: Multi-level Alternate Level Stairs-Rails: None Alternate Level Stairs-Number of Steps: 5 Home Access: Stairs to enter Entrance Stairs-Rails: None Entrance Stairs-Number of Steps: 2 Home Adaptive Equipment: None Prior Function Level of Independence: Independent with basic ADLs;Independent with homemaking with ambulation;Independent with gait;Independent with transfers Driving: Yes Vocation: Retired Producer, television/film/video: Awake/alert Overall Cognitive Status: Appears within functional limits for tasks assessed Orientation Level: Oriented X4 Sensation/Coordination   Extremity Assessment   Mobility (including Balance) Bed Mobility Bed Mobility: Yes Supine to Sit: 4: Min assist Supine to Sit Details (indicate cue type and reason): verbal cues for sequencing Transfers Transfers: Yes Sit to Stand: 4: Min assist;With upper  extremity assist Sit to Stand Details (indicate cue type and reason): verbal cues for hand placement Stand to Sit: 4: Min assist;With upper extremity assist;With armrests Stand to Sit  Details: min guard assist, verbal cues for sequencing Ambulation/Gait Ambulation/Gait: Yes Ambulation/Gait Assistance: 4: Min assist Ambulation/Gait Assistance Details (indicate cue type and reason): min guard assist, verbal cues for sequencing, RW management Ambulation Distance (Feet): 80 Feet Assistive device: Rolling walker Gait Pattern: Antalgic    Exercise  Total Joint Exercises Ankle Circles/Pumps: AROM;Both;10 reps Quad Sets: AROM;Left;5 reps Heel Slides: AAROM;Left (5-45 degrees L knee) End of Session PT - End of Session Equipment Utilized During Treatment: Gait belt;Left knee immobilizer Activity Tolerance: Patient tolerated treatment well Patient left: in chair;with call bell in reach;with family/visitor present Nurse Communication: Mobility status for transfers General Behavior During Session: [facility] for tasks performed Cognition: [facility] for tasks performed  Ilda Foil 03/30/2011, 1:14 PM  Aida Raider, PT  Office # [phone] Pager [phone]

## 2011-03-30 NOTE — Progress Notes (Signed)
Subjective: 1 Day Post-Op Procedure(s) (LRB): TOTAL KNEE ARTHROPLASTY (Left) Patient reports pain as 3 on 0-10 scale.    Objective: Vital signs in last 24 hours: Temp:  [97.7 F (36.5 C)-98.6 F (37 C)] 98.6 F (37 C) (01/26 0630) Pulse Rate:  [63-94] 94  (01/26 0630) Resp:  [13-18] 14  (01/26 0800) BP: (106-137)/(41-77) 137/58 mmHg (01/26 0630) SpO2:  [97 %-100 %] 98 % (01/26 0800)  Intake/Output from previous day: 01/25 0701 - 01/26 0700 In: 5230 [P.O.:1480; I.V.:3150] Out: 2575 [Urine:2125; Drains:325; Blood:125] Intake/Output this shift: Total I/O In: 480 [P.O.:480] Out: 400 [Urine:400]   Basename 03/30/11 0615  HGB 9.7*    Basename 03/30/11 0615  WBC 14.7*  RBC 3.66*  HCT 29.9*  PLT 245    Basename 03/30/11 0615  NA 138  K 4.3  CL 102  CO2 27  BUN 15  CREATININE 0.68  GLUCOSE 125*  CALCIUM 8.8    Basename 03/30/11 0615  LABPT --  INR 1.11    Neurologically intact ABD soft Neurovascular intact Sensation intact distally Intact pulses distally Dorsiflexion/Plantar flexion intact Compartment soft  Assessment/Plan: 1 Day Post-Op Procedure(s) (LRB): TOTAL KNEE ARTHROPLASTY (Left) Advance diet Up with therapy Plan home Monday  Alfie Alderfer L 03/30/2011, 12:03 PM

## 2011-03-30 NOTE — Progress Notes (Signed)
56 yo female s/p total knee arthroplasty  Anticoag: s/p Total knee;started on coumadin therapy.  INR 1.11.  Coumadin score = 5 Goal of Therapy: INR 1.5-2  Plan:  1) Repeat Coumadin 5mg  po x1 2) INR in am

## 2011-03-31 LAB — CBC
Platelets: 212 10*3/uL (ref 150–400)
RDW: 16.4 % — ABNORMAL HIGH (ref 11.5–15.5)
WBC: 11.3 10*3/uL — ABNORMAL HIGH (ref 4.0–10.5)

## 2011-03-31 LAB — PROTIME-INR: INR: 1.19 (ref 0.00–1.49)

## 2011-03-31 MED ORDER — WARFARIN SODIUM 7.5 MG PO TABS
7.5000 mg | ORAL_TABLET | Freq: Once | ORAL | Status: AC
  Administered 2011-03-31: 7.5 mg via ORAL
  Filled 2011-03-31: qty 1

## 2011-03-31 NOTE — Progress Notes (Signed)
56 yo female s/p total knee arthroplasty  Anticoag: s/p Total knee;started on coumadin therapy. INR 1.19. Coumadin score = 5 Goal of Therapy: INR 1.5-2 Plan:  1) Coumadin 7.5mg  po x1 2) INR in am

## 2011-03-31 NOTE — Progress Notes (Signed)
Physical Therapy Treatment Patient Details Name: Kaitlyn House MRN: [id number] DOB: 1955/09/26 Today's Date: 03/31/2011  PT Assessment/Plan  PT - Assessment/Plan Comments on Treatment Session: Pt with increased pain today, but still able to ambulate equal distance as yesterday.  D/C plan is for home tomorrow. PT Plan: Discharge plan remains appropriate;Frequency remains appropriate Follow Up Recommendations: Home health PT Equipment Recommended: Rolling walker with 5" wheels PT Goals  Acute Rehab PT Goals PT Goal: Supine/Side to Sit - Progress: Progressing toward goal PT Goal: Sit to Supine/Side - Progress: Progressing toward goal PT Goal: Sit to Stand - Progress: Progressing toward goal PT Goal: Stand to Sit - Progress: Progressing toward goal PT Transfer Goal: Bed to Chair/Chair to Bed - Progress: Progressing toward goal PT Goal: Ambulate - Progress: Progressing toward goal PT Goal: Up/Down Stairs - Progress: Progressing toward goal PT Goal: Perform Home Exercise Program - Progress: Progressing toward goal  PT Treatment Precautions/Restrictions  Precautions Precautions: Knee Required Braces or Orthoses: Yes Knee Immobilizer: On except when in CPM Restrictions Weight Bearing Restrictions: No RLE Weight Bearing: Weight bearing as tolerated LLE Weight Bearing: Weight bearing as tolerated Mobility (including Balance) Transfers Sit to Stand: 4: Min assist;With upper extremity assist;With armrests;From chair/3-in-1 Sit to Stand Details (indicate cue type and reason): verbal cues for sequencing Stand to Sit: 4: Min assist;To chair/3-in-1;With upper extremity assist;With armrests Stand to Sit Details: min guard assist, verbal cues for sequencing Ambulation/Gait Ambulation/Gait Assistance: 4: Min assist Ambulation/Gait Assistance Details (indicate cue type and reason): min guard assist, verbal cues for step-through gait pattern Ambulation Distance (Feet): 80 Feet Assistive  device: Rolling walker Gait Pattern: Step-to pattern;Decreased stance time - left Gait velocity: decreased    Exercise  Total Joint Exercises Ankle Circles/Pumps: AROM;Both;10 reps Quad Sets: AAROM;Left;10 reps Heel Slides: AAROM;Left;5 reps Hip ABduction/ADduction: AAROM;Left;10 reps End of Session PT - End of Session Equipment Utilized During Treatment: Gait belt;Left knee immobilizer Activity Tolerance: Patient tolerated treatment well Patient left: in chair;with call bell in reach;with family/visitor present General Behavior During Session: [facility] for tasks performed Cognition: [facility] for tasks performed  Ilda Foil 03/31/2011, 12:18 PM  Aida Raider, PT  Office # [phone] Pager [phone]

## 2011-03-31 NOTE — Progress Notes (Signed)
Physical Therapy Treatment Patient Details Name: Kaitlyn House MRN: [id number] DOB: 1956/02/15 Today's Date: 03/31/2011  PT Assessment/Plan  PT - Assessment/Plan Comments on Treatment Session: Pt progressing well, although still limited by pain. Continue per plan, complete stairs tomorrow prior to d/c PT Plan: Discharge plan remains appropriate;Frequency remains appropriate PT Frequency: 7X/week Follow Up Recommendations: Home health PT Equipment Recommended: Rolling walker with 5" wheels PT Goals  Acute Rehab PT Goals PT Goal Formulation: With patient PT Goal: Supine/Side to Sit - Progress: Progressing toward goal PT Goal: Sit to Supine/Side - Progress: Progressing toward goal PT Goal: Sit to Stand - Progress: Progressing toward goal PT Goal: Stand to Sit - Progress: Progressing toward goal PT Transfer Goal: Bed to Chair/Chair to Bed - Progress: Progressing toward goal PT Goal: Ambulate - Progress: Progressing toward goal  PT Treatment Precautions/Restrictions  Precautions Precautions: Knee Required Braces or Orthoses: Yes Knee Immobilizer: On except when in CPM Restrictions Weight Bearing Restrictions: Yes RLE Weight Bearing: Weight bearing as tolerated LLE Weight Bearing: Weight bearing as tolerated Mobility (including Balance) Bed Mobility Bed Mobility: Yes Sit to Supine: 4: Min assist Sit to Supine - Details (indicate cue type and reason): Assist with LLE into supine. VC for sequencing. Transfers Transfers: Yes Sit to Stand: 4: Min assist;With upper extremity assist;With armrests;From chair/3-in-1 Sit to Stand Details (indicate cue type and reason): VC for hand placement for safety as well as technique Stand to Sit: 4: Min assist;With upper extremity assist;With armrests;To bed;To chair/3-in-1 Stand to Sit Details: VC for hand placement for safety from RW Ambulation/Gait Ambulation/Gait: Yes Ambulation/Gait Assistance: 4: Min assist (Minguard  assist) Ambulation/Gait Assistance Details (indicate cue type and reason): VC throughout for postural cues and distance to RW for technique and safety.  (Pt difficulty taking larger step lengths, even with cueing) Ambulation Distance (Feet): 50 Feet Assistive device: Rolling walker Gait Pattern: Step-to pattern;Decreased stance time - left;Decreased stride length Gait velocity: decreased Stairs: No    Exercise    End of Session PT - End of Session Equipment Utilized During Treatment: Gait belt;Left knee immobilizer Activity Tolerance: Patient tolerated treatment well Patient left: in bed;in CPM;with call bell in reach;with family/visitor present Nurse Communication: Mobility status for transfers;Mobility status for ambulation General Behavior During Session: [facility] for tasks performed Cognition: [facility] for tasks performed  Milana Kidney 03/31/2011, 5:39 PM  03/31/2011 Milana Kidney DPT PAGER: [phone] OFFICE: [phone]

## 2011-03-31 NOTE — Progress Notes (Signed)
Occupational Therapy Note Spoke with patient. She feels she will be ok with all ADL at discharge. She reports that her daughter helped her up to the bathroom today and she did ok. She has an elevated toilet at home with a vanity beside and declines need for 3in1. She has 24/7 assist PRN with ADL. She states she can sponge off initially until ready to step into a tub. Will sign off per pt request. Judithann Sauger OTR/L [phone] 03/31/2011

## 2011-03-31 NOTE — Progress Notes (Signed)
Subjective: 2 Days Post-Op Procedure(s) (LRB): TOTAL KNEE ARTHROPLASTY (Left) Patient reports pain as moderate. C/o headache. Taking po/voiding ok   Objective: Vital signs in last 24 hours: Temp:  [98.7 F (37.1 C)-99.4 F (37.4 C)] 99.4 F (37.4 C) (01/27 0655) Pulse Rate:  [80-98] 98  (01/27 0655) Resp:  [14-18] 18  (01/27 0658) BP: (114-156)/(50-82) 136/74 mmHg (01/27 0655) SpO2:  [92 %-99 %] 97 % (01/27 0658) FiO2 (%):  [92 %-97 %] 97 % (01/27 0658)  Intake/Output from previous day: 01/26 0701 - 01/27 0700 In: 2350 [P.O.:2200; I.V.:150] Out: 930 [Urine:900; Drains:30] Intake/Output this shift:     Basename 03/31/11 0500 03/30/11 0615  HGB 9.5* 9.7*    Basename 03/31/11 0500 03/30/11 0615  WBC 11.3* 14.7*  RBC 3.56* 3.66*  HCT 29.1* 29.9*  PLT 212 245    Basename 03/30/11 0615  NA 138  K 4.3  CL 102  CO2 27  BUN 15  CREATININE 0.68  GLUCOSE 125*  CALCIUM 8.8    Basename 03/31/11 0500 03/30/11 0615  LABPT -- --  INR 1.19 1.11  Left Knee: Incision clean and dry. Calf soft. N-V intact distally.  Assessment/Plan: 2 Days Post-Op Procedure(s) (LRB): TOTAL KNEE ARTHROPLASTY (Left) Up with therapy D/C IV fluids Plan for discharge tomorrow Cont coumadin Dressing changed drain pulled.  Bellamy Rubey G 03/31/2011, 12:16 PM

## 2011-04-01 LAB — PROTIME-INR: INR: 1.45 (ref 0.00–1.49)

## 2011-04-01 MED ORDER — WARFARIN SODIUM 5 MG PO TABS
5.0000 mg | ORAL_TABLET | Freq: Once | ORAL | Status: DC
  Filled 2011-04-01: qty 1

## 2011-04-01 MED ORDER — OXYCODONE-ACETAMINOPHEN 5-325 MG PO TABS: 1.0000 | ORAL_TABLET | ORAL | Status: AC | PRN

## 2011-04-01 MED ORDER — METHOCARBAMOL 750 MG PO TABS: ORAL_TABLET | ORAL | Status: DC

## 2011-04-01 MED ORDER — WARFARIN SODIUM 5 MG PO TABS: ORAL_TABLET | ORAL | Status: DC

## 2011-04-01 MED ORDER — WARFARIN SODIUM 5 MG PO TABS: 5.0000 mg | ORAL_TABLET | Freq: Once | ORAL | Status: DC

## 2011-04-01 NOTE — Progress Notes (Signed)
Physical Therapy Treatment Patient Details Name: Kaitlyn House MRN: [id number] DOB: 1955/07/28 Today's Date: 04/01/2011  PT Assessment/Plan  PT - Assessment/Plan Comments on Treatment Session: patient with improved ambulation tolerance. Patient with request to use restroom. Patient I with transfer on/off commode with use of hand rails. Patient safe to return home with assist of spouse. PT Plan: Discharge plan remains appropriate PT Frequency: 7X/week Follow Up Recommendations: Home health PT Equipment Recommended: Rolling walker with 5" wheels PT Goals  Acute Rehab PT Goals PT Goal: Supine/Side to Sit - Progress: Progressing toward goal PT Goal: Sit to Supine/Side - Progress: Progressing toward goal PT Goal: Sit to Stand - Progress: Progressing toward goal PT Goal: Stand to Sit - Progress: Progressing toward goal PT Transfer Goal: Bed to Chair/Chair to Bed - Progress: Progressing toward goal PT Goal: Ambulate - Progress: Progressing toward goal PT Goal: Up/Down Stairs - Progress: Progressing toward goal PT Goal: Perform Home Exercise Program - Progress: Progressing toward goal  PT Treatment Precautions/Restrictions  Precautions Precautions: Knee Required Braces or Orthoses: Yes Knee Immobilizer: On except when in CPM Restrictions Weight Bearing Restrictions: No RLE Weight Bearing: Weight bearing as tolerated LLE Weight Bearing: Weight bearing as tolerated Mobility (including Balance) Bed Mobility Bed Mobility:  (pt received sitting up in chair upon arrival) Transfers Sit to Stand: 4: Min assist Stand to Sit: 4: Min assist Ambulation/Gait Ambulation/Gait Assistance: 4: Min assist (contact guard) Ambulation Distance (Feet): 75 Feet Assistive device: Rolling walker Gait Pattern: Step-to pattern;Decreased step length - left;Decreased stance time - left Gait velocity: decreased cadence Stairs Assistance: 4: Min assist Stairs Assistance Details (indicate cue type and  reason): directional verbal cues Stair Management Technique: One rail Right;Forwards (also completed backwards with RW for home entry) Number of Stairs: 3     Exercise    End of Session PT - End of Session Equipment Utilized During Treatment: Gait belt Activity Tolerance: Patient tolerated treatment well Patient left: in chair;with call bell in reach General Behavior During Session: [facility] for tasks performed Cognition: [facility] for tasks performed  Sreeja Spies, Becky Sax 04/01/2011, 1:22 PM  Lewis Shock, PT, DPT Pager #: [phone] Office #: [phone]

## 2011-04-01 NOTE — Progress Notes (Signed)
Subjective: 3 Days Post-Op Procedure(s) (LRB): TOTAL KNEE ARTHROPLASTY (Left) Patient reports pain as 4 on 0-10 scale.    Objective: Vital signs in last 24 hours: Temp:  [98.1 F (36.7 C)-99.4 F (37.4 C)] 99.4 F (37.4 C) (01/28 0534) Pulse Rate:  [99-105] 101  (01/28 0534) Resp:  [18-20] 18  (01/28 0534) BP: (139-145)/(81-92) 139/92 mmHg (01/28 0534) SpO2:  [90 %-95 %] 95 % (01/28 0534)  Intake/Output from previous day:   Intake/Output this shift:     Basename 03/31/11 0500 03/30/11 0615  HGB 9.5* 9.7*    Basename 03/31/11 0500 03/30/11 0615  WBC 11.3* 14.7*  RBC 3.56* 3.66*  HCT 29.1* 29.9*  PLT 212 245    Basename 03/30/11 0615  NA 138  K 4.3  CL 102  CO2 27  BUN 15  CREATININE 0.68  GLUCOSE 125*  CALCIUM 8.8    Basename 04/01/11 0500 03/31/11 0500  LABPT -- --  INR 1.45 1.19  Left knee exam:  Neurovascular intact Sensation intact distally Intact pulses distally Dorsiflexion/Plantar flexion intact Incision: no drainage  Assessment/Plan: 3 Days Post-Op Procedure(s) (LRB): TOTAL KNEE ARTHROPLASTY (Left) Discharge home with home health F/U Dr Luiz Blare 10 days.  Kaitlyn House G 04/01/2011, 8:16 AM

## 2011-04-01 NOTE — Progress Notes (Signed)
ANTICOAGULATION CONSULT NOTE - Follow Up Consult  Pharmacy Consult for coumadin Indication: VTE prophylaxis  No Known Allergies  Vital Signs: Temp: 99.4 F (37.4 C) (01/28 0534) Temp src: Oral (01/28 0534) BP: 139/92 mmHg (01/28 0534) Pulse Rate: 101  (01/28 0534)  Labs:  Basename 04/01/11 0500 03/31/11 0500 03/30/11 0615  HGB -- 9.5* 9.7*  HCT -- 29.1* 29.9*  PLT -- 212 245  APTT -- -- --  LABPROT 17.9* 15.4* 14.5  INR 1.45 1.19 1.11  HEPARINUNFRC -- -- --  CREATININE -- -- 0.68  CKTOTAL -- -- --  CKMB -- -- --  TROPONINI -- -- --   CrCl is unknown because there is no height on file for the current visit.   Medications:  Prescriptions prior to admission  Medication Sig Dispense Refill  . Ascorbic Acid (VITAMIN C) 1000 MG tablet Take 1,000 mg by mouth at bedtime.      . Calcium Carbonate-Vit D-Min (CALCIUM 600+D PLUS MINERALS) 600-400 MG-UNIT TABS Take 2 tablets by mouth every morning.      . diphenhydrAMINE (BENADRYL) 25 MG tablet Take 25-50 mg by mouth at bedtime as needed. Congestion      . esomeprazole (NEXIUM) 20 MG capsule Take 20 mg by mouth daily as needed. Acid reflux      . fesoterodine (TOVIAZ) 4 MG TB24 Take 4 mg by mouth at bedtime.      . polysaccharide iron (NIFEREX) 150 MG CAPS capsule Take 150 mg by mouth at bedtime.      . promethazine (PHENERGAN) 25 MG tablet Take 25 mg by mouth every 6 (six) hours as needed. For nausea with hydrocodone      . pseudoephedrine-guaifenesin (MUCINEX D) 60-600 MG per tablet Take 1 tablet by mouth daily after breakfast. Congestion      . DISCONTD: aspirin-acetaminophen-caffeine (EXCEDRIN MIGRAINE) 250-250-65 MG per tablet Take 2 tablets by mouth every 6 (six) hours as needed. migraines      . DISCONTD: HYDROcodone-acetaminophen (VICODIN) 5-500 MG per tablet Take 1 tablet by mouth every 6 (six) hours as needed. For pain      . DISCONTD: ibuprofen (ADVIL,MOTRIN) 200 MG tablet Take 400-600 mg by mouth every 6 (six) hours as  needed. For pain      . DISCONTD: scopolamine (TRANSDERM-SCOP) 1.5 MG Place 1 patch (1.5 mg total) onto the skin every 3 (three) days.  4 patch  1    Assessment: 56 yo F post-op day #3 s/p L TKA.  INR today 1.45, slightly below goal.  No bleeding noted per ortho note.    Goal of Therapy:  INR 1.5-2   Plan:  1) Coumadin 5 mg po x1 today 2) INR in am   Verniece Encarnacion, Maryagnes Amos 04/01/2011,10:37 AM

## 2011-04-02 ENCOUNTER — Encounter (HOSPITAL_COMMUNITY): Payer: Self-pay | Admitting: Orthopedic Surgery

## 2011-04-12 NOTE — Discharge Summary (Signed)
Patient ID: Kaitlyn House MRN: [id number] DOB/AGE: [birth year] 56 y.o.  Admit date: 03/29/2011 Discharge date: [id number] Admission Diagnoses:  Principal Problem:  *Osteoarthritis of left knee   Discharge Diagnoses:  Same  Past Medical History  Diagnosis Date  . PONV (postoperative nausea and vomiting)   . Spinal headache   . Anemia     tx with iron    Surgeries: Procedure(s):Left computer assisted  TOTAL KNEE ARTHROPLASTY on 03/29/2011  Discharged Condition: Improved  Hospital Course: Kaitlyn House is an 56 y.o. female who was admitted 03/29/2011 for operative treatment ofOsteoarthritis of left knee. Patient has severe unremitting pain that affects sleep, daily activities, and work/hobbies. After pre-op clearance the patient was taken to the operating room on 03/29/2011 and underwent  Procedure(s):Left TOTAL KNEE ARTHROPLASTY.    Patient was given perioperative antibiotics:  Anti-infectives     Start     Dose/Rate Route Frequency Ordered Stop   03/29/11 1500   ceFAZolin (ANCEF) IVPB 2 g/50 mL premix        2 g 100 mL/hr over 30 Minutes Intravenous Every 6 hours 03/29/11 1404 03/30/11 0444   03/29/11 0900   cefUROXime (ZINACEF) injection  Status:  Discontinued          As needed 03/29/11 0900 03/29/11 1038   03/28/11 1500   ceFAZolin (ANCEF) IVPB 2 g/50 mL premix        2 g 100 mL/hr over 30 Minutes Intravenous 60 min pre-op 03/28/11 1452 03/29/11 0755           Patient was given sequential compression devices, early ambulation, and chemoprophylaxis to prevent DVT.  Patient benefited maximally from hospital stay and there were no complications.    Recent vital signs:see chart   Recent laboratory studies:  See chart  Discharge Medications:   Medication List  As of 04/12/2011  2:12 PM   STOP taking these medications         aspirin-acetaminophen-caffeine 250-250-65 MG per tablet      HYDROcodone-acetaminophen 5-500 MG per tablet      ibuprofen 200 MG tablet        scopolamine 1.5 MG         TAKE these medications         Calcium 600+D Plus Minerals 600-400 MG-UNIT Tabs   Take 2 tablets by mouth every morning.      diphenhydrAMINE 25 MG tablet   Commonly known as: BENADRYL   Take 25-50 mg by mouth at bedtime as needed. Congestion      esomeprazole 20 MG capsule   Commonly known as: NEXIUM   Take 20 mg by mouth daily as needed. Acid reflux      methocarbamol 750 MG tablet   Commonly known as: ROBAXIN   One q 8 hrs prn spasm.      polysaccharide iron 150 MG Caps capsule   Commonly known as: NIFEREX   Take 150 mg by mouth at bedtime.      promethazine 25 MG tablet   Commonly known as: PHENERGAN   Take 25 mg by mouth every 6 (six) hours as needed. For nausea with hydrocodone      pseudoephedrine-guaifenesin 60-600 MG per tablet   Commonly known as: MUCINEX D   Take 1 tablet by mouth daily after breakfast. Congestion      TOVIAZ 4 MG Tb24   Generic drug: fesoterodine   Take 4 mg by mouth at bedtime.      vitamin C 1000 MG tablet  Take 1,000 mg by mouth at bedtime.      warfarin 5 MG tablet   Commonly known as: COUMADIN   One tablet daily unless otherwise directed.      warfarin 5 MG tablet   Commonly known as: COUMADIN   Take 1 tablet (5 mg total) by mouth one time only at 6 PM.            Diagnostic Studies: Dg Chest 2 View  03/29/2011  *RADIOLOGY REPORT*  Clinical Data: Preoperative chest radiograph.  CHEST - 2 VIEW  Comparison: None.  Findings: The lungs are well-aerated.  Minimal bibasilar atelectasis is noted.  There is no evidence of pleural effusion or pneumothorax.  The heart is normal in size; the mediastinal contour is within normal limits.  No acute osseous abnormalities are seen.  IMPRESSION: Minimal bibasilar atelectasis noted; lungs otherwise clear.  Original Report Authenticated By: Tonia Ghent, M.D.    Disposition: Home or Self Care  Discharge Orders    Future Orders Please Complete By Expires    Diet general      Call MD / Call 911      Comments:   If you experience chest pain or shortness of breath, CALL 911 and be transported to the hospital emergency room.  If you develope a fever above 101 F, pus (white drainage) or increased drainage or redness at the wound, or calf pain, call your surgeon's office.   Weight Bearing as taught in Physical Therapy      Comments:   Use a walker or crutches as instructed.   Discharge wound care:      Comments:   If you have a hip bandage, keep it clean and dry.  Change your bandage as instructed by your health care providers.  If your bandage has been discontinued, keep your incision clean and dry.  Pat dry after bathing.  DO NOT put lotion or powder on your incision.   CPM      Comments:   Continuous passive motion machine (CPM):      Use the CPM from0 to 60 for 8 hours per day.      You may increase by 10 degrees per day.  You may break it up into 2 or 3 sessions per day.      Use CPM for 1-2 weeks or until you are told to stop.   Do not put a pillow under the knee. Place it under the heel.         Follow-up Information    Follow up with GRAVES,JOHN L, MD in 10 days.   Contact information:   [address] [place] [address] [phone]           Signed: Matthew Folks 04/12/2011, 2:12 PM

## 2011-05-07 ENCOUNTER — Other Ambulatory Visit: Payer: Self-pay | Admitting: Orthopedic Surgery

## 2011-06-03 DIAGNOSIS — Z87828 Personal history of other (healed) physical injury and trauma: Secondary | ICD-10-CM

## 2011-06-03 HISTORY — DX: Personal history of other (healed) physical injury and trauma: Z87.828

## 2011-06-03 HISTORY — PX: ORIF WRIST FRACTURE: SHX2133

## 2011-06-25 ENCOUNTER — Other Ambulatory Visit (HOSPITAL_COMMUNITY): Payer: BC Managed Care – PPO

## 2011-06-28 ENCOUNTER — Ambulatory Visit (HOSPITAL_COMMUNITY)
Admission: RE | Admit: 2011-06-28 | Payer: BC Managed Care – PPO | Source: Ambulatory Visit | Admitting: Orthopedic Surgery

## 2011-06-28 ENCOUNTER — Encounter (HOSPITAL_COMMUNITY): Admission: RE | Payer: Self-pay | Source: Ambulatory Visit

## 2011-06-28 SURGERY — ARTHROPLASTY, KNEE, TOTAL, USING IMAGELESS COMPUTER-ASSISTED NAVIGATION
Anesthesia: General | Laterality: Right

## 2011-10-03 ENCOUNTER — Other Ambulatory Visit: Payer: Self-pay | Admitting: Orthopedic Surgery

## 2011-10-03 DIAGNOSIS — M66239 Spontaneous rupture of extensor tendons, unspecified forearm: Secondary | ICD-10-CM

## 2011-10-03 HISTORY — DX: Spontaneous rupture of extensor tendons, unspecified forearm: M66.239

## 2011-10-18 ENCOUNTER — Encounter (HOSPITAL_BASED_OUTPATIENT_CLINIC_OR_DEPARTMENT_OTHER): Payer: Self-pay | Admitting: *Deleted

## 2011-10-23 ENCOUNTER — Ambulatory Visit (HOSPITAL_BASED_OUTPATIENT_CLINIC_OR_DEPARTMENT_OTHER)
Admission: RE | Admit: 2011-10-23 | Discharge: 2011-10-23 | Disposition: A | Discharge status: Home / Self Care | Payer: BC Managed Care – PPO | Source: Ambulatory Visit | Attending: Orthopedic Surgery | Admitting: Orthopedic Surgery

## 2011-10-23 ENCOUNTER — Encounter (HOSPITAL_BASED_OUTPATIENT_CLINIC_OR_DEPARTMENT_OTHER): Payer: Self-pay | Admitting: Anesthesiology

## 2011-10-23 ENCOUNTER — Encounter (HOSPITAL_BASED_OUTPATIENT_CLINIC_OR_DEPARTMENT_OTHER): Payer: Self-pay | Admitting: *Deleted

## 2011-10-23 ENCOUNTER — Ambulatory Visit (HOSPITAL_BASED_OUTPATIENT_CLINIC_OR_DEPARTMENT_OTHER): Payer: BC Managed Care – PPO | Admitting: Anesthesiology

## 2011-10-23 ENCOUNTER — Encounter (HOSPITAL_BASED_OUTPATIENT_CLINIC_OR_DEPARTMENT_OTHER)
Admission: RE | Disposition: A | Discharge status: Home / Self Care | Payer: Self-pay | Source: Ambulatory Visit | Attending: Orthopedic Surgery

## 2011-10-23 DIAGNOSIS — K219 Gastro-esophageal reflux disease without esophagitis: Secondary | ICD-10-CM | POA: Insufficient documentation

## 2011-10-23 DIAGNOSIS — M249 Joint derangement, unspecified: Secondary | ICD-10-CM | POA: Insufficient documentation

## 2011-10-23 HISTORY — DX: Gastro-esophageal reflux disease without esophagitis: K21.9

## 2011-10-23 HISTORY — DX: Overactive bladder: N32.81

## 2011-10-23 HISTORY — DX: Headache: R51

## 2011-10-23 HISTORY — PX: TENDON REPAIR: SHX5111

## 2011-10-23 HISTORY — DX: Spontaneous rupture of extensor tendons, unspecified forearm: M66.239

## 2011-10-23 HISTORY — DX: Unspecified osteoarthritis, unspecified site: M19.90

## 2011-10-23 HISTORY — DX: Other seasonal allergic rhinitis: J30.2

## 2011-10-23 HISTORY — DX: Spontaneous rupture of extensor tendons, unspecified hand: M66.249

## 2011-10-23 HISTORY — DX: Dental restoration status: Z98.811

## 2011-10-23 HISTORY — DX: Personal history of other (healed) physical injury and trauma: Z87.828

## 2011-10-23 LAB — POCT HEMOGLOBIN-HEMACUE: Hemoglobin: 13.3 g/dL (ref 12.0–15.0)

## 2011-10-23 SURGERY — TENDON REPAIR
Anesthesia: General | Site: Wrist | Laterality: Right | Wound class: Clean

## 2011-10-23 MED ORDER — BUPIVACAINE HCL (PF) 0.5 % IJ SOLN
INTRAMUSCULAR | Status: DC | PRN
  Administered 2011-10-23: 10 mL

## 2011-10-23 MED ORDER — POVIDONE-IODINE 7.5 % EX SOLN: Freq: Once | CUTANEOUS | Status: DC

## 2011-10-23 MED ORDER — OXYCODONE-ACETAMINOPHEN 5-325 MG PO TABS: 1.0000 | ORAL_TABLET | ORAL | Status: AC | PRN

## 2011-10-23 MED ORDER — KETOROLAC TROMETHAMINE 30 MG/ML IJ SOLN
INTRAMUSCULAR | Status: DC | PRN
  Administered 2011-10-23: 30 mg via INTRAVENOUS

## 2011-10-23 MED ORDER — ONDANSETRON HCL 4 MG/2ML IJ SOLN
INTRAMUSCULAR | Status: DC | PRN
  Administered 2011-10-23: 4 mg via INTRAVENOUS

## 2011-10-23 MED ORDER — LACTATED RINGERS IV SOLN
INTRAVENOUS | Status: DC
  Administered 2011-10-23: 09:00:00 via INTRAVENOUS

## 2011-10-23 MED ORDER — CEFAZOLIN SODIUM-DEXTROSE 2-3 GM-% IV SOLR
2.0000 g | INTRAVENOUS | Status: AC
  Administered 2011-10-23: 2 g via INTRAVENOUS

## 2011-10-23 MED ORDER — PROPOFOL 10 MG/ML IV EMUL
INTRAVENOUS | Status: DC | PRN
  Administered 2011-10-23: 250 mg via INTRAVENOUS

## 2011-10-23 MED ORDER — LIDOCAINE HCL (CARDIAC) 20 MG/ML IV SOLN
INTRAVENOUS | Status: DC | PRN
  Administered 2011-10-23: 50 mg via INTRAVENOUS

## 2011-10-23 MED ORDER — ACETAMINOPHEN 10 MG/ML IV SOLN
1000.0000 mg | Freq: Once | INTRAVENOUS | Status: AC
  Administered 2011-10-23: 1000 mg via INTRAVENOUS

## 2011-10-23 MED ORDER — SCOPOLAMINE 1 MG/3DAYS TD PT72
1.0000 | MEDICATED_PATCH | Freq: Once | TRANSDERMAL | Status: DC
  Administered 2011-10-23: 1.5 mg via TRANSDERMAL

## 2011-10-23 SURGICAL SUPPLY — 68 items
BANDAGE ELASTIC 3 VELCRO ST LF (GAUZE/BANDAGES/DRESSINGS) ×2 IMPLANT
BENZOIN TINCTURE PRP APPL 2/3 (GAUZE/BANDAGES/DRESSINGS) IMPLANT
BLADE MINI RND TIP GREEN BEAV (BLADE) ×2 IMPLANT
BLADE SURG 15 STRL LF DISP TIS (BLADE) ×1 IMPLANT
BLADE SURG 15 STRL SS (BLADE) ×1
BNDG ESMARK 4X9 LF (GAUZE/BANDAGES/DRESSINGS) ×2 IMPLANT
CANISTER SUCTION 1200CC (MISCELLANEOUS) IMPLANT
CLOTH BEACON ORANGE TIMEOUT ST (SAFETY) ×2 IMPLANT
CORDS BIPOLAR (ELECTRODE) ×2 IMPLANT
COVER MAYO STAND STRL (DRAPES) ×2 IMPLANT
COVER TABLE BACK 60X90 (DRAPES) ×2 IMPLANT
CUFF TOURNIQUET SINGLE 18IN (TOURNIQUET CUFF) ×2 IMPLANT
DECANTER SPIKE VIAL GLASS SM (MISCELLANEOUS) IMPLANT
DRAIN PENROSE 1/4X12 LTX STRL (WOUND CARE) IMPLANT
DRAPE EXTREMITY T 121X128X90 (DRAPE) ×2 IMPLANT
DRAPE OEC MINIVIEW 54X84 (DRAPES) IMPLANT
DRAPE SURG 17X23 STRL (DRAPES) ×2 IMPLANT
DRAPE U-SHAPE 47X51 STRL (DRAPES) IMPLANT
DURAPREP 26ML APPLICATOR (WOUND CARE) ×2 IMPLANT
ELECT NEEDLE TIP 2.8 STRL (NEEDLE) IMPLANT
ELECT REM PT RETURN 9FT ADLT (ELECTROSURGICAL)
ELECTRODE REM PT RTRN 9FT ADLT (ELECTROSURGICAL) IMPLANT
GAUZE XEROFORM 1X8 LF (GAUZE/BANDAGES/DRESSINGS) ×2 IMPLANT
GLOVE BIO SURGEON STRL SZ 6.5 (GLOVE) ×2 IMPLANT
GLOVE BIOGEL PI IND STRL 8 (GLOVE) ×1 IMPLANT
GLOVE BIOGEL PI INDICATOR 8 (GLOVE) ×1
GLOVE ECLIPSE 7.5 STRL STRAW (GLOVE) ×2 IMPLANT
GLOVE INDICATOR 7.0 STRL GRN (GLOVE) ×2 IMPLANT
GOWN BRE IMP PREV XXLGXLNG (GOWN DISPOSABLE) ×2 IMPLANT
GOWN PREVENTION PLUS XLARGE (GOWN DISPOSABLE) ×2 IMPLANT
GOWN PREVENTION PLUS XXLARGE (GOWN DISPOSABLE) IMPLANT
NEEDLE FISTULA 1/2 CIRCLE (NEEDLE) IMPLANT
NEEDLE HYPO 25X1 1.5 SAFETY (NEEDLE) ×2 IMPLANT
NEEDLE KEITH (NEEDLE) IMPLANT
NS IRRIG 1000ML POUR BTL (IV SOLUTION) ×2 IMPLANT
PACK BASIN DAY SURGERY FS (CUSTOM PROCEDURE TRAY) ×2 IMPLANT
PAD CAST 3X4 CTTN HI CHSV (CAST SUPPLIES) ×1 IMPLANT
PADDING CAST ABS 4INX4YD NS (CAST SUPPLIES) ×1
PADDING CAST ABS COTTON 4X4 ST (CAST SUPPLIES) ×1 IMPLANT
PADDING CAST COTTON 3X4 STRL (CAST SUPPLIES) ×1
PASSER SUT SWANSON 36MM LOOP (INSTRUMENTS) IMPLANT
PENCIL BUTTON HOLSTER BLD 10FT (ELECTRODE) IMPLANT
SHEET MEDIUM DRAPE 40X70 STRL (DRAPES) IMPLANT
SLING ARM FOAM STRAP MED (SOFTGOODS) ×2 IMPLANT
SPLINT PLASTER CAST XFAST 3X15 (CAST SUPPLIES) IMPLANT
SPLINT PLASTER XTRA FASTSET 3X (CAST SUPPLIES)
SPONGE GAUZE 4X4 12PLY (GAUZE/BANDAGES/DRESSINGS) ×2 IMPLANT
STOCKINETTE 4X48 STRL (DRAPES) ×2 IMPLANT
STRIP CLOSURE SKIN 1/2X4 (GAUZE/BANDAGES/DRESSINGS) IMPLANT
SUCTION FRAZIER TIP 10 FR DISP (SUCTIONS) IMPLANT
SUT ETHIBOND 3-0 V-5 (SUTURE) IMPLANT
SUT ETHILON 4 0 PS 2 18 (SUTURE) ×2 IMPLANT
SUT FIBERWIRE 4-0 18 DIAM BLUE (SUTURE) ×2
SUT POLY BUTTON 15MM (SUTURE) IMPLANT
SUT PROLENE 3 0 PS 2 (SUTURE) IMPLANT
SUT VIC AB 2-0 SH 27 (SUTURE)
SUT VIC AB 2-0 SH 27XBRD (SUTURE) IMPLANT
SUT VIC AB 4-0 P-3 18XBRD (SUTURE) ×1 IMPLANT
SUT VIC AB 4-0 P3 18 (SUTURE) ×1
SUT VICRYL 4-0 PS2 18IN ABS (SUTURE) IMPLANT
SUTURE FIBERWR 4-0 18 DIA BLUE (SUTURE) ×1 IMPLANT
SYR BULB 3OZ (MISCELLANEOUS) ×2 IMPLANT
SYR CONTROL 10ML LL (SYRINGE) ×2 IMPLANT
TOWEL OR 17X24 6PK STRL BLUE (TOWEL DISPOSABLE) ×2 IMPLANT
TOWEL OR NON WOVEN STRL DISP B (DISPOSABLE) ×2 IMPLANT
TUBE CONNECTING 20X1/4 (TUBING) IMPLANT
UNDERPAD 30X30 INCONTINENT (UNDERPADS AND DIAPERS) ×2 IMPLANT
WATER STERILE IRR 1000ML POUR (IV SOLUTION) IMPLANT
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
## 2011-10-23 NOTE — Anesthesia Procedure Notes (Signed)
Procedure Name: LMA Insertion Date/Time: 10/23/2011 10:14 AM Performed by: Gar Gibbon Pre-anesthesia Checklist: Patient identified, Emergency Drugs available, Suction available and Patient being monitored Patient Re-evaluated:Patient Re-evaluated prior to inductionOxygen Delivery Method: Circle System Utilized Preoxygenation: Pre-oxygenation with 100% oxygen Intubation Type: IV induction Ventilation: Mask ventilation without difficulty LMA: LMA with gastric port inserted LMA Size: 4.0 Number of attempts: 1 Placement Confirmation: positive ETCO2 Tube secured with: Tape Dental Injury: Teeth and Oropharynx as per pre-operative assessment

## 2011-10-23 NOTE — Anesthesia Preprocedure Evaluation (Signed)
Anesthesia Evaluation  Patient identified by MRN, date of birth, ID band Patient awake    Reviewed: Allergy & Precautions, H&P , NPO status , Patient's Chart, lab work & pertinent test results, reviewed documented beta blocker date and time   History of Anesthesia Complications (+) PONV  Airway Mallampati: II TM Distance: >3 FB Neck ROM: full    Dental   Pulmonary neg pulmonary ROS,  breath sounds clear to auscultation        Cardiovascular negative cardio ROS  Rhythm:regular     Neuro/Psych  Headaches,  Neuromuscular disease negative psych ROS   GI/Hepatic Neg liver ROS, GERD-  Medicated and Controlled,  Endo/Other  negative endocrine ROS  Renal/GU negative Renal ROS  negative genitourinary   Musculoskeletal   Abdominal   Peds  Hematology negative hematology ROS (+)   Anesthesia Other Findings See surgeon's H&P   Reproductive/Obstetrics negative OB ROS                           Anesthesia Physical Anesthesia Plan  ASA: III  Anesthesia Plan: General   Post-op Pain Management:    Induction: Intravenous  Airway Management Planned: LMA  Additional Equipment:   Intra-op Plan:   Post-operative Plan: Extubation in OR  Informed Consent: I have reviewed the patients History and Physical, chart, labs and discussed the procedure including the risks, benefits and alternatives for the proposed anesthesia with the patient or authorized representative who has indicated his/her understanding and acceptance.   Dental Advisory Given  Plan Discussed with: CRNA and Surgeon  Anesthesia Plan Comments:         Anesthesia Quick Evaluation

## 2011-10-23 NOTE — Brief Op Note (Signed)
10/23/2011  11:24 AM  PATIENT:  Kaitlyn House  56 y.o. female  PRE-OPERATIVE DIAGNOSIS:  EPL RUPTURE RIGHT WRIST  POST-OPERATIVE DIAGNOSIS:  EPL RUPTURE RIGHT WRIST  PROCEDURE:  Procedure(s) (LRB): TENDON REPAIR (Right)  SURGEON:  Surgeon(s) and Role:    * Harvie Junior, MD - Primary  PHYSICIAN ASSISTANT:   ASSISTANTS: none   ANESTHESIA:   general  EBL:  Total I/O In: 700 [I.V.:700] Out: -   BLOOD ADMINISTERED:none  DRAINS: none and Gastrostomy Tube   LOCAL MEDICATIONS USED:  MARCAINE     SPECIMEN:  No Specimen  DISPOSITION OF SPECIMEN:  N/A  COUNTS:  YES  TOURNIQUET:   Total Tourniquet Time Documented: Upper Arm (Right) - 56 minutes  DICTATION: .Other Dictation: Dictation Number [id number]  PLAN OF CARE: Discharge to home after PACU  PATIENT DISPOSITION:  PACU - hemodynamically stable.   Delay start of Pharmacological VTE agent (>24hrs) due to surgical blood loss or risk of bleeding: not applicable

## 2011-10-23 NOTE — Transfer of Care (Signed)
Immediate Anesthesia Transfer of Care Note  Patient: Kaitlyn House  Procedure(s) Performed: Procedure(s) (LRB): TENDON REPAIR (Right)  Patient Location: PACU  Anesthesia Type: General  Level of Consciousness: awake and alert   Airway & Oxygen Therapy: Patient Spontanous Breathing and Patient connected to face mask oxygen  Post-op Assessment: Report given to PACU RN and Post -op Vital signs reviewed and stable  Post vital signs: Reviewed and stable  Complications: No apparent anesthesia complications

## 2011-10-23 NOTE — Anesthesia Postprocedure Evaluation (Signed)
Anesthesia Post Note  Patient: Kaitlyn House  Procedure(s) Performed: Procedure(s) (LRB): TENDON REPAIR (Right)  Anesthesia type: General  Patient location: PACU  Post pain: Pain level controlled  Post assessment: Patient's Cardiovascular Status Stable  Last Vitals:  Filed Vitals:   10/23/11 1203  BP: 150/85  Pulse: 74  Temp: 36.8 C  Resp: 16    Post vital signs: Reviewed and stable  Level of consciousness: alert  Complications: No apparent anesthesia complications
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
## 2011-10-29 ENCOUNTER — Encounter (HOSPITAL_BASED_OUTPATIENT_CLINIC_OR_DEPARTMENT_OTHER): Payer: Self-pay | Admitting: Orthopedic Surgery

## 2011-10-29 ENCOUNTER — Encounter (HOSPITAL_BASED_OUTPATIENT_CLINIC_OR_DEPARTMENT_OTHER): Payer: Self-pay

## 2013-02-23 ENCOUNTER — Other Ambulatory Visit: Payer: Self-pay | Admitting: Orthopedic Surgery

## 2013-03-01 ENCOUNTER — Ambulatory Visit (HOSPITAL_COMMUNITY)
Admission: RE | Admit: 2013-03-01 | Discharge: 2013-03-01 | Disposition: A | Discharge status: Home / Self Care | Payer: BC Managed Care – PPO | Source: Ambulatory Visit | Attending: Orthopedic Surgery | Admitting: Orthopedic Surgery

## 2013-03-01 ENCOUNTER — Encounter (HOSPITAL_COMMUNITY): Payer: Self-pay

## 2013-03-01 ENCOUNTER — Encounter (HOSPITAL_COMMUNITY)
Admission: RE | Admit: 2013-03-01 | Discharge: 2013-03-01 | Disposition: A | Discharge status: Home / Self Care | Payer: BC Managed Care – PPO | Source: Ambulatory Visit | Attending: Orthopedic Surgery | Admitting: Orthopedic Surgery

## 2013-03-01 DIAGNOSIS — Z0183 Encounter for blood typing: Secondary | ICD-10-CM | POA: Diagnosis not present

## 2013-03-01 DIAGNOSIS — Z01810 Encounter for preprocedural cardiovascular examination: Secondary | ICD-10-CM | POA: Insufficient documentation

## 2013-03-01 DIAGNOSIS — M1710 Unilateral primary osteoarthritis, unspecified knee: Secondary | ICD-10-CM | POA: Insufficient documentation

## 2013-03-01 DIAGNOSIS — Z01812 Encounter for preprocedural laboratory examination: Secondary | ICD-10-CM | POA: Insufficient documentation

## 2013-03-01 DIAGNOSIS — Z01818 Encounter for other preprocedural examination: Secondary | ICD-10-CM | POA: Insufficient documentation

## 2013-03-01 LAB — COMPREHENSIVE METABOLIC PANEL
Albumin: 4.2 g/dL (ref 3.5–5.2)
Alkaline Phosphatase: 76 U/L (ref 39–117)
BUN: 16 mg/dL (ref 6–23)
Chloride: 102 mEq/L (ref 96–112)
GFR calc Af Amer: >90 mL/min (ref >90)
Potassium: 4.2 mEq/L (ref 3.5–5.1)
Sodium: 140 mEq/L (ref 135–145)
Total Protein: 7.6 g/dL (ref 6.0–8.3)

## 2013-03-01 LAB — URINALYSIS, ROUTINE W REFLEX MICROSCOPIC
Bilirubin Urine: NEGATIVE
Glucose, UA: NEGATIVE mg/dL
Leukocytes, UA: NEGATIVE
Nitrite: NEGATIVE
Protein, ur: NEGATIVE mg/dL
Urobilinogen, UA: 0.2 mg/dL (ref 0.0–1.0)

## 2013-03-01 LAB — CBC WITH DIFFERENTIAL/PLATELET
Basophils Relative: 0 % (ref 0–1)
Eosinophils Absolute: 0.2 10*3/uL (ref 0.0–0.7)
Lymphocytes Relative: 21 % (ref 12–46)
MCH: 28.6 pg (ref 26.0–34.0)
MCHC: 33.3 g/dL (ref 30.0–36.0)
Monocytes Relative: 7 % (ref 3–12)
Neutro Abs: 6.8 10*3/uL (ref 1.7–7.7)
Neutrophils Relative %: 70 % (ref 43–77)
Platelets: 282 10*3/uL (ref 150–400)
WBC: 9.6 10*3/uL (ref 4.0–10.5)

## 2013-03-01 LAB — URINE MICROSCOPIC-ADD ON

## 2013-03-01 LAB — TYPE AND SCREEN: ABO/RH(D): O POS

## 2013-03-01 NOTE — Progress Notes (Signed)
Patient stated  she had sleep study back the the late 80's early 90's for migraines.  Denies any problems currently.   DA

## 2013-03-01 NOTE — Pre-Procedure Instructions (Signed)
Kaitlyn House  03/01/2013   Your procedure is scheduled on:  Monday, Jan. 5th             [facility] Parking is available)   Report to [facility]  2 * 3 at 10:30 AM.  Call this number if you have problems the morning of surgery: [phone]   Remember:   Do not eat food or drink liquids after midnight Sunday.   Take these medicines the morning of surgery with A SIP OF WATER: Nexium   Do not wear jewelry, make-up or nail polish.  Do not wear lotions, powders, or perfumes. You may NOT wear deodorant.  Do not shave underarms & legs 48 hours prior to surgery.    Do not bring valuables to the hospital.  [facility] is not responsible for any belongings or valuables.               Contacts, dentures or bridgework may not be worn into surgery.  Leave suitcase in the car. After surgery it may be brought to your room.  For patients admitted to the hospital, discharge time is determined by your treatment team.               Name and phone number of your driver:  Kaitlyn House  -  SPOUSE   Special Instructions: Shower using CHG 2 nights before surgery and the night before surgery.  If you shower the day of surgery use CHG.  Use special wash - you have one bottle of CHG for all showers.  You should use approximately 1/3 of the bottle for each shower.   Please read over the following fact sheets that you were given: Pain Booklet, Blood Transfusion Information, MRSA Information and Surgical Site Infection Prevention

## 2013-03-07 MED ORDER — CEFAZOLIN SODIUM-DEXTROSE 2-3 GM-% IV SOLR
2.0000 g | INTRAVENOUS | Status: AC
  Administered 2013-03-08: 2 g via INTRAVENOUS
  Filled 2013-03-07: qty 50

## 2013-03-08 ENCOUNTER — Encounter (HOSPITAL_COMMUNITY): Payer: BC Managed Care – PPO | Admitting: Certified Registered Nurse Anesthetist

## 2013-03-08 ENCOUNTER — Encounter (HOSPITAL_COMMUNITY)
Admission: RE | Disposition: A | Discharge status: Home Health | Payer: Self-pay | Source: Ambulatory Visit | Attending: Orthopedic Surgery

## 2013-03-08 ENCOUNTER — Encounter (HOSPITAL_COMMUNITY): Payer: Self-pay | Admitting: Certified Registered Nurse Anesthetist

## 2013-03-08 ENCOUNTER — Ambulatory Visit (HOSPITAL_COMMUNITY): Payer: BC Managed Care – PPO | Admitting: Certified Registered Nurse Anesthetist

## 2013-03-08 ENCOUNTER — Inpatient Hospital Stay (HOSPITAL_COMMUNITY)
Admission: RE | Admit: 2013-03-08 | Discharge: 2013-03-10 | DRG: 470 | Disposition: A | Discharge status: Home Health | Payer: BC Managed Care – PPO | Source: Ambulatory Visit | Attending: Orthopedic Surgery | Admitting: Orthopedic Surgery

## 2013-03-08 DIAGNOSIS — Z87891 Personal history of nicotine dependence: Secondary | ICD-10-CM

## 2013-03-08 DIAGNOSIS — Z91018 Allergy to other foods: Secondary | ICD-10-CM

## 2013-03-08 DIAGNOSIS — K219 Gastro-esophageal reflux disease without esophagitis: Secondary | ICD-10-CM | POA: Diagnosis present

## 2013-03-08 DIAGNOSIS — Z6841 Body Mass Index (BMI) 40.0 and over, adult: Secondary | ICD-10-CM

## 2013-03-08 DIAGNOSIS — R110 Nausea: Secondary | ICD-10-CM | POA: Diagnosis not present

## 2013-03-08 DIAGNOSIS — Z885 Allergy status to narcotic agent status: Secondary | ICD-10-CM

## 2013-03-08 DIAGNOSIS — T3995XA Adverse effect of unspecified nonopioid analgesic, antipyretic and antirheumatic, initial encounter: Secondary | ICD-10-CM

## 2013-03-08 DIAGNOSIS — M1710 Unilateral primary osteoarthritis, unspecified knee: Principal | ICD-10-CM | POA: Diagnosis present

## 2013-03-08 DIAGNOSIS — T398X5A Adverse effect of other nonopioid analgesics and antipyretics, not elsewhere classified, initial encounter: Secondary | ICD-10-CM | POA: Diagnosis not present

## 2013-03-08 DIAGNOSIS — M1711 Unilateral primary osteoarthritis, right knee: Secondary | ICD-10-CM

## 2013-03-08 HISTORY — DX: Family history of other specified conditions: Z84.89

## 2013-03-08 HISTORY — PX: TOTAL KNEE ARTHROPLASTY: SHX125

## 2013-03-08 SURGERY — ARTHROPLASTY, KNEE, TOTAL
Anesthesia: General | Site: Knee | Laterality: Right

## 2013-03-08 MED ORDER — KETOROLAC TROMETHAMINE 30 MG/ML IJ SOLN
INTRAMUSCULAR | Status: AC
  Administered 2013-03-08: 30 mg
  Filled 2013-03-08: qty 1

## 2013-03-08 MED ORDER — TRANEXAMIC ACID 100 MG/ML IV SOLN
1000.0000 mg | INTRAVENOUS | Status: AC
  Administered 2013-03-08: 1000 mg via INTRAVENOUS
  Filled 2013-03-08: qty 10

## 2013-03-08 MED ORDER — FENTANYL CITRATE 0.05 MG/ML IJ SOLN
50.0000 ug | INTRAMUSCULAR | Status: DC | PRN
  Administered 2013-03-08: 100 ug via INTRAVENOUS
  Filled 2013-03-08: qty 2

## 2013-03-08 MED ORDER — OXYCODONE HCL 5 MG PO TABS
ORAL_TABLET | ORAL | Status: AC
  Filled 2013-03-08: qty 1

## 2013-03-08 MED ORDER — PHENYLEPHRINE HCL 10 MG/ML IJ SOLN
INTRAMUSCULAR | Status: DC | PRN
  Administered 2013-03-08 (×4): 80 ug via INTRAVENOUS

## 2013-03-08 MED ORDER — DEXAMETHASONE SODIUM PHOSPHATE 10 MG/ML IJ SOLN
10.0000 mg | Freq: Three times a day (TID) | INTRAMUSCULAR | Status: AC
  Filled 2013-03-08 (×3): qty 1

## 2013-03-08 MED ORDER — DEXAMETHASONE 6 MG PO TABS
10.0000 mg | ORAL_TABLET | Freq: Three times a day (TID) | ORAL | Status: AC
  Administered 2013-03-09 (×3): 10 mg via ORAL
  Filled 2013-03-08 (×3): qty 1

## 2013-03-08 MED ORDER — ZOLPIDEM TARTRATE 5 MG PO TABS: 5.0000 mg | ORAL_TABLET | Freq: Every evening | ORAL | Status: DC | PRN

## 2013-03-08 MED ORDER — MIDAZOLAM HCL 2 MG/2ML IJ SOLN
1.0000 mg | INTRAMUSCULAR | Status: DC | PRN
  Administered 2013-03-08: 2 mg via INTRAVENOUS
  Filled 2013-03-08: qty 2

## 2013-03-08 MED ORDER — PROPOFOL 10 MG/ML IV BOLUS
INTRAVENOUS | Status: DC | PRN
  Administered 2013-03-08: 150 mg via INTRAVENOUS
  Administered 2013-03-08: 50 mg via INTRAVENOUS

## 2013-03-08 MED ORDER — POVIDONE-IODINE 7.5 % EX SOLN: Freq: Once | CUTANEOUS | Status: DC

## 2013-03-08 MED ORDER — LIDOCAINE HCL (CARDIAC) 10 MG/ML IV SOLN
INTRAVENOUS | Status: DC | PRN
  Administered 2013-03-08: 80 mg via INTRAVENOUS

## 2013-03-08 MED ORDER — SODIUM CHLORIDE 0.9 % IR SOLN
Status: DC | PRN
  Administered 2013-03-08: 1000 mL

## 2013-03-08 MED ORDER — METHOCARBAMOL 500 MG PO TABS
500.0000 mg | ORAL_TABLET | Freq: Four times a day (QID) | ORAL | Status: DC | PRN
  Administered 2013-03-08 – 2013-03-10 (×3): 500 mg via ORAL
  Filled 2013-03-08 (×3): qty 1

## 2013-03-08 MED ORDER — PROMETHAZINE HCL 25 MG/ML IJ SOLN
INTRAMUSCULAR | Status: AC
  Filled 2013-03-08: qty 1

## 2013-03-08 MED ORDER — MEPERIDINE HCL 25 MG/ML IJ SOLN: 6.2500 mg | INTRAMUSCULAR | Status: DC | PRN

## 2013-03-08 MED ORDER — OXYCODONE-ACETAMINOPHEN 5-325 MG PO TABS
ORAL_TABLET | ORAL | Status: AC
  Filled 2013-03-08: qty 2

## 2013-03-08 MED ORDER — LACTATED RINGERS IV SOLN
INTRAVENOUS | Status: DC | PRN
  Administered 2013-03-08 (×2): via INTRAVENOUS

## 2013-03-08 MED ORDER — HYDROMORPHONE HCL PF 1 MG/ML IJ SOLN
0.2500 mg | INTRAMUSCULAR | Status: DC | PRN
  Administered 2013-03-08 (×4): 0.5 mg via INTRAVENOUS

## 2013-03-08 MED ORDER — DEXTROSE 5 % IV SOLN
500.0000 mg | Freq: Four times a day (QID) | INTRAVENOUS | Status: DC | PRN
  Administered 2013-03-08: 500 mg via INTRAVENOUS
  Filled 2013-03-08: qty 5

## 2013-03-08 MED ORDER — FENTANYL CITRATE 0.05 MG/ML IJ SOLN
INTRAMUSCULAR | Status: DC | PRN
  Administered 2013-03-08 (×2): 50 ug via INTRAVENOUS
  Administered 2013-03-08: 100 ug via INTRAVENOUS
  Administered 2013-03-08 (×3): 50 ug via INTRAVENOUS
  Administered 2013-03-08: 25 ug via INTRAVENOUS

## 2013-03-08 MED ORDER — SCOPOLAMINE 1 MG/3DAYS TD PT72
MEDICATED_PATCH | TRANSDERMAL | Status: AC
  Filled 2013-03-08: qty 1

## 2013-03-08 MED ORDER — SCOPOLAMINE 1 MG/3DAYS TD PT72: 1.0000 | MEDICATED_PATCH | Freq: Once | TRANSDERMAL | Status: DC

## 2013-03-08 MED ORDER — DOCUSATE SODIUM 100 MG PO CAPS
100.0000 mg | ORAL_CAPSULE | Freq: Two times a day (BID) | ORAL | Status: DC
  Administered 2013-03-09 (×3): 100 mg via ORAL
  Filled 2013-03-08 (×2): qty 1

## 2013-03-08 MED ORDER — PROMETHAZINE HCL 25 MG/ML IJ SOLN
12.5000 mg | Freq: Four times a day (QID) | INTRAMUSCULAR | Status: DC | PRN
  Filled 2013-03-08: qty 1

## 2013-03-08 MED ORDER — DEXAMETHASONE SODIUM PHOSPHATE 4 MG/ML IJ SOLN
INTRAMUSCULAR | Status: DC | PRN
  Administered 2013-03-08: 8 mg via INTRAVENOUS

## 2013-03-08 MED ORDER — ONDANSETRON HCL 4 MG/2ML IJ SOLN
INTRAMUSCULAR | Status: DC | PRN
  Administered 2013-03-08: 4 mg via INTRAVENOUS

## 2013-03-08 MED ORDER — METHOCARBAMOL 500 MG PO TABS
ORAL_TABLET | ORAL | Status: AC
  Filled 2013-03-08: qty 1

## 2013-03-08 MED ORDER — OXYCODONE HCL 5 MG/5ML PO SOLN: 5.0000 mg | Freq: Once | ORAL | Status: AC | PRN

## 2013-03-08 MED ORDER — SODIUM CHLORIDE 0.9 % IR SOLN
Status: DC | PRN
  Administered 2013-03-08 (×2): 1000 mL

## 2013-03-08 MED ORDER — LACTATED RINGERS IV SOLN
INTRAVENOUS | Status: DC
  Administered 2013-03-08: 11:00:00 via INTRAVENOUS

## 2013-03-08 MED ORDER — MIDAZOLAM HCL 2 MG/2ML IJ SOLN: 0.5000 mg | Freq: Once | INTRAMUSCULAR | Status: AC | PRN

## 2013-03-08 MED ORDER — HYDROMORPHONE HCL PF 1 MG/ML IJ SOLN
INTRAMUSCULAR | Status: AC
  Filled 2013-03-08: qty 1

## 2013-03-08 MED ORDER — OXYCODONE HCL 5 MG PO TABS
5.0000 mg | ORAL_TABLET | Freq: Once | ORAL | Status: AC | PRN
  Administered 2013-03-08: 5 mg via ORAL

## 2013-03-08 MED ORDER — PROPOFOL INFUSION 10 MG/ML OPTIME
INTRAVENOUS | Status: DC | PRN
  Administered 2013-03-08: 50 ug/kg/min via INTRAVENOUS

## 2013-03-08 MED ORDER — KETOROLAC TROMETHAMINE 15 MG/ML IJ SOLN
15.0000 mg | Freq: Four times a day (QID) | INTRAMUSCULAR | Status: AC
  Administered 2013-03-09 (×3): 15 mg via INTRAVENOUS
  Filled 2013-03-08 (×5): qty 1

## 2013-03-08 MED ORDER — PROMETHAZINE HCL 25 MG/ML IJ SOLN
6.2500 mg | INTRAMUSCULAR | Status: DC | PRN
  Administered 2013-03-08: 6.25 mg via INTRAVENOUS

## 2013-03-08 MED ORDER — SCOPOLAMINE 1 MG/3DAYS TD PT72
1.0000 | MEDICATED_PATCH | TRANSDERMAL | Status: DC
  Administered 2013-03-08: 1.5 mg via TRANSDERMAL

## 2013-03-08 MED ORDER — BUPIVACAINE-EPINEPHRINE PF 0.5-1:200000 % IJ SOLN
INTRAMUSCULAR | Status: DC | PRN
  Administered 2013-03-08: 30 mL via PERINEURAL

## 2013-03-08 MED ORDER — HYDROMORPHONE HCL PF 1 MG/ML IJ SOLN
1.0000 mg | INTRAMUSCULAR | Status: DC | PRN
  Administered 2013-03-08: 1 mg via INTRAVENOUS
  Administered 2013-03-09: 2 mg via INTRAVENOUS
  Administered 2013-03-09: 1 mg via INTRAVENOUS
  Filled 2013-03-08: qty 1
  Filled 2013-03-08: qty 2

## 2013-03-08 MED ORDER — OXYCODONE-ACETAMINOPHEN 5-325 MG PO TABS
1.0000 | ORAL_TABLET | ORAL | Status: DC | PRN
  Administered 2013-03-08 – 2013-03-10 (×4): 2 via ORAL
  Filled 2013-03-08 (×3): qty 2

## 2013-03-08 MED ORDER — PANTOPRAZOLE SODIUM 40 MG PO TBEC
40.0000 mg | DELAYED_RELEASE_TABLET | Freq: Every day | ORAL | Status: DC
  Administered 2013-03-09: 40 mg via ORAL
  Filled 2013-03-08: qty 1

## 2013-03-08 MED ORDER — MIDAZOLAM HCL 5 MG/5ML IJ SOLN
INTRAMUSCULAR | Status: DC | PRN
  Administered 2013-03-08: 2 mg via INTRAVENOUS

## 2013-03-08 MED ORDER — SODIUM CHLORIDE 0.9 % IV SOLN
10.0000 mg | INTRAVENOUS | Status: DC | PRN
  Administered 2013-03-08: 20 ug/min via INTRAVENOUS

## 2013-03-08 MED ORDER — CEFAZOLIN SODIUM-DEXTROSE 2-3 GM-% IV SOLR
2.0000 g | Freq: Four times a day (QID) | INTRAVENOUS | Status: AC
  Administered 2013-03-08 – 2013-03-09 (×2): 2 g via INTRAVENOUS
  Filled 2013-03-08 (×2): qty 50

## 2013-03-08 MED ORDER — POLYSACCHARIDE IRON COMPLEX 150 MG PO CAPS
150.0000 mg | ORAL_CAPSULE | Freq: Every day | ORAL | Status: DC
  Administered 2013-03-09 (×2): 150 mg via ORAL
  Filled 2013-03-08 (×3): qty 1

## 2013-03-08 MED ORDER — HYDROMORPHONE HCL PF 1 MG/ML IJ SOLN: 0.5000 mg | INTRAMUSCULAR | Status: DC | PRN

## 2013-03-08 MED ORDER — FESOTERODINE FUMARATE ER 4 MG PO TB24
4.0000 mg | ORAL_TABLET | Freq: Every day | ORAL | Status: DC
  Administered 2013-03-09 (×2): 4 mg via ORAL
  Filled 2013-03-08 (×3): qty 1

## 2013-03-08 MED ORDER — HYDROMORPHONE HCL PF 1 MG/ML IJ SOLN
INTRAMUSCULAR | Status: AC
  Administered 2013-03-08: 1 mg
  Filled 2013-03-08: qty 1

## 2013-03-08 SURGICAL SUPPLY — 65 items
BANDAGE ESMARK 6X9 LF (GAUZE/BANDAGES/DRESSINGS) ×1 IMPLANT
BENZOIN TINCTURE PRP APPL 2/3 (GAUZE/BANDAGES/DRESSINGS) ×3 IMPLANT
BLADE SAGITTAL 25.0X1.19X90 (BLADE) ×2 IMPLANT
BLADE SAGITTAL 25.0X1.19X90MM (BLADE) ×1
BLADE SAW SAG 90X13X1.27 (BLADE) ×3 IMPLANT
BNDG ESMARK 6X9 LF (GAUZE/BANDAGES/DRESSINGS) ×3
BOWL SMART MIX CTS (DISPOSABLE) ×3 IMPLANT
CAP UPCHARGE REVISION TRAY ×3 IMPLANT
CAPT RP KNEE ×3 IMPLANT
CEMENT HV SMART SET (Cement) ×6 IMPLANT
CLOSURE STERI-STRIP 1/2X4 (GAUZE/BANDAGES/DRESSINGS) ×1
CLOSURE WOUND 1/2 X4 (GAUZE/BANDAGES/DRESSINGS) ×1
CLOTH BEACON ORANGE TIMEOUT ST (SAFETY) ×3 IMPLANT
CLSR STERI-STRIP ANTIMIC 1/2X4 (GAUZE/BANDAGES/DRESSINGS) ×2 IMPLANT
COVER SURGICAL LIGHT HANDLE (MISCELLANEOUS) ×3 IMPLANT
CUFF TOURNIQUET SINGLE 34IN LL (TOURNIQUET CUFF) ×3 IMPLANT
CUFF TOURNIQUET SINGLE 44IN (TOURNIQUET CUFF) IMPLANT
DRAPE EXTREMITY T 121X128X90 (DRAPE) ×3 IMPLANT
DRAPE U-SHAPE 47X51 STRL (DRAPES) ×3 IMPLANT
DRSG ADAPTIC 3X8 NADH LF (GAUZE/BANDAGES/DRESSINGS) ×3 IMPLANT
DRSG PAD ABDOMINAL 8X10 ST (GAUZE/BANDAGES/DRESSINGS) ×3 IMPLANT
DURAPREP 26ML APPLICATOR (WOUND CARE) ×3 IMPLANT
ELECT REM PT RETURN 9FT ADLT (ELECTROSURGICAL) ×3
ELECTRODE REM PT RTRN 9FT ADLT (ELECTROSURGICAL) ×1 IMPLANT
EVACUATOR 1/8 PVC DRAIN (DRAIN) ×3 IMPLANT
FACESHIELD LNG OPTICON STERILE (SAFETY) ×3 IMPLANT
GAUZE XEROFORM 5X9 LF (GAUZE/BANDAGES/DRESSINGS) ×3 IMPLANT
GLOVE BIOGEL M 7.0 STRL (GLOVE) ×3 IMPLANT
GLOVE BIOGEL PI IND STRL 7.0 (GLOVE) ×1 IMPLANT
GLOVE BIOGEL PI IND STRL 8 (GLOVE) ×2 IMPLANT
GLOVE BIOGEL PI INDICATOR 7.0 (GLOVE) ×2
GLOVE BIOGEL PI INDICATOR 8 (GLOVE) ×4
GLOVE ECLIPSE 7.5 STRL STRAW (GLOVE) ×6 IMPLANT
GOWN PREVENTION PLUS LG XLONG (DISPOSABLE) IMPLANT
GOWN STRL NON-REIN LRG LVL3 (GOWN DISPOSABLE) ×3 IMPLANT
GOWN STRL REIN XL XLG (GOWN DISPOSABLE) ×6 IMPLANT
HANDPIECE INTERPULSE COAX TIP (DISPOSABLE) ×2
HOOD PEEL AWAY FACE SHEILD DIS (HOOD) ×9 IMPLANT
IMMOBILIZER KNEE 20 (SOFTGOODS)
IMMOBILIZER KNEE 20 THIGH 36 (SOFTGOODS) IMPLANT
IMMOBILIZER KNEE 22 UNIV (SOFTGOODS) ×3 IMPLANT
KIT BASIN OR (CUSTOM PROCEDURE TRAY) ×3 IMPLANT
KIT ROOM TURNOVER OR (KITS) ×3 IMPLANT
MANIFOLD NEPTUNE II (INSTRUMENTS) ×3 IMPLANT
NEEDLE HYPO 25GX1X1/2 BEV (NEEDLE) IMPLANT
NS IRRIG 1000ML POUR BTL (IV SOLUTION) ×3 IMPLANT
PACK TOTAL JOINT (CUSTOM PROCEDURE TRAY) ×3 IMPLANT
PAD ARMBOARD 7.5X6 YLW CONV (MISCELLANEOUS) ×6 IMPLANT
PAD CAST 4YDX4 CTTN HI CHSV (CAST SUPPLIES) ×1 IMPLANT
PADDING CAST COTTON 4X4 STRL (CAST SUPPLIES) ×2
SET HNDPC FAN SPRY TIP SCT (DISPOSABLE) ×1 IMPLANT
SPONGE GAUZE 4X4 12PLY (GAUZE/BANDAGES/DRESSINGS) ×3 IMPLANT
STAPLER VISISTAT 35W (STAPLE) ×3 IMPLANT
STRIP CLOSURE SKIN 1/2X4 (GAUZE/BANDAGES/DRESSINGS) ×2 IMPLANT
SUCTION FRAZIER TIP 10 FR DISP (SUCTIONS) ×3 IMPLANT
SUT MNCRL AB 3-0 PS2 18 (SUTURE) IMPLANT
SUT VIC AB 0 CTB1 27 (SUTURE) ×6 IMPLANT
SUT VIC AB 1 CT1 27 (SUTURE) ×6
SUT VIC AB 1 CT1 27XBRD ANBCTR (SUTURE) ×3 IMPLANT
SUT VIC AB 2-0 CTB1 (SUTURE) ×6 IMPLANT
SYR CONTROL 10ML LL (SYRINGE) IMPLANT
TOWEL OR 17X24 6PK STRL BLUE (TOWEL DISPOSABLE) ×3 IMPLANT
TOWEL OR 17X26 10 PK STRL BLUE (TOWEL DISPOSABLE) ×3 IMPLANT
TRAY FOLEY CATH 16FRSI W/METER (SET/KITS/TRAYS/PACK) ×3 IMPLANT
WATER STERILE IRR 1000ML POUR (IV SOLUTION) IMPLANT

## 2013-03-08 NOTE — Anesthesia Postprocedure Evaluation (Signed)
  Anesthesia Post-op Note  Patient: Kaitlyn House  Procedure(s) Performed: Procedure(s): TOTAL KNEE ARTHROPLASTY (Right)  Patient Location: PACU  Anesthesia Type:General and block  Level of Consciousness: awake and alert   Airway and Oxygen Therapy: Patient Spontanous Breathing  Post-op Pain: mild  Post-op Assessment: Post-op Vital signs reviewed, Patient's Cardiovascular Status Stable and Respiratory Function Stable  Post-op Vital Signs: Reviewed  Filed Vitals:   03/08/13 1700  BP:   Pulse: 79  Temp:   Resp: 20    Complications: No apparent anesthesia complications

## 2013-03-08 NOTE — Progress Notes (Signed)
Orthopedic Tech Progress Note Patient Details:  Kaitlyn House [birth year] [id number]  CPM Right Knee CPM Right Knee: On Right Knee Flexion (Degrees): 60 Right Knee Extension (Degrees): 0 Additional Comments: Trapeze bar    Cammer, [name] 03/08/2013, 4:13 PM

## 2013-03-08 NOTE — Anesthesia Preprocedure Evaluation (Addendum)
Anesthesia Evaluation  Patient identified by MRN, date of birth, ID band Patient awake    Reviewed: Allergy & Precautions, H&P , NPO status , Patient's Chart, lab work & pertinent test results  History of Anesthesia Complications (+) PONV, POST - OP SPINAL HEADACHE and history of anesthetic complications  Airway Mallampati: I TM Distance: >3 FB Neck ROM: Full    Dental  (+) Teeth Intact and Dental Advisory Given   Pulmonary former smoker (quit '80),  breath sounds clear to auscultation  Pulmonary exam normal       Cardiovascular negative cardio ROS  Rhythm:Regular Rate:Normal     Neuro/Psych  Headaches,    GI/Hepatic Neg liver ROS, GERD-  Medicated and Controlled,  Endo/Other  Morbid obesity  Renal/GU negative Renal ROS     Musculoskeletal  (+) Arthritis -, Osteoarthritis,    Abdominal (+) + obese,   Peds  Hematology negative hematology ROS (+)   Anesthesia Other Findings   Reproductive/Obstetrics                          Anesthesia Physical Anesthesia Plan  ASA: II  Anesthesia Plan: General   Post-op Pain Management:    Induction: Intravenous  Airway Management Planned: LMA  Additional Equipment:   Intra-op Plan:   Post-operative Plan: Extubation in OR  Informed Consent: I have reviewed the patients History and Physical, chart, labs and discussed the procedure including the risks, benefits and alternatives for the proposed anesthesia with the patient or authorized representative who has indicated his/her understanding and acceptance.   Dental advisory given  Plan Discussed with: Surgeon and CRNA  Anesthesia Plan Comments: (Plan routine monitors, GA- LMA OK)        Anesthesia Quick Evaluation

## 2013-03-08 NOTE — Transfer of Care (Signed)
Immediate Anesthesia Transfer of Care Note  Patient: Kaitlyn House  Procedure(s) Performed: Procedure(s): TOTAL KNEE ARTHROPLASTY (Right)  Patient Location: PACU  Anesthesia Type:General  Level of Consciousness: awake, alert , oriented and patient cooperative  Airway & Oxygen Therapy: Patient Spontanous Breathing and Patient connected to nasal cannula oxygen  Post-op Assessment: Report given to PACU RN, Post -op Vital signs reviewed and stable and Patient moving all extremities X 4  Post vital signs: Reviewed and stable  Complications: No apparent anesthesia complications

## 2013-03-08 NOTE — Progress Notes (Signed)
Report given to Lemar Livingsebecca G.

## 2013-03-08 NOTE — Anesthesia Procedure Notes (Signed)
Anesthesia Regional Block:  Femoral nerve block  Pre-Anesthetic Checklist: ,, timeout performed, Correct Patient, Correct Site, Correct Laterality, Correct Procedure, Correct Position, site marked, Risks and benefits discussed,  Surgical consent,  Pre-op evaluation,  At surgeon's request and post-op pain management  Laterality: Right and Lower  Prep: chloraprep       Needles:  Injection technique: Single-shot  Needle Type: Echogenic Stimulator Needle     Needle Length:cm 4 cm Needle Gauge: 22 and 22 G    Additional Needles: Femoral nerve block  Nerve Stimulator or Paresthesia:  Response: patella twitch, 0.45 mA, 0.1 ms,   Additional Responses:   Narrative:  Start time: 03/08/2013 12:25 PM End time: 03/08/2013 12:31 PM Injection made incrementally with aspirations every 5 mL.  Performed by: Personally  Anesthesiologist: Sandford Craze Rada Zegers, MD  Additional Notes: Pt identified in Holding room.  Monitors applied. Working IV access confirmed. Sterile prep R groin.  #22ga PNS to patellar twitch at 0.1345mA threshold.  30cc 0.5% Bupivacaine with 1:200k epi injected incrementally after negative test dose.  Patient asymptomatic, VSS, no heme aspirated, tolerated well.  Sandford Craze Brycelyn Gambino, MD

## 2013-03-08 NOTE — OR Nursing (Signed)
Spoke in person with son re staus and newly anticipated bed assignment on 6n

## 2013-03-08 NOTE — Brief Op Note (Signed)
03/08/2013  2:57 PM  PATIENT:  Kaitlyn House  58 y.o. female  PRE-OPERATIVE DIAGNOSIS:  degenerative joint disease  POST-OPERATIVE DIAGNOSIS:  degenerative joint disease  PROCEDURE:  Procedure(s): TOTAL KNEE ARTHROPLASTY (Right)  SURGEON:  Surgeon(s) and Role:    * Harvie JuniorJohn L Vivianna Piccini, MD - Primary  PHYSICIAN ASSISTANT:   ASSISTANTS: bethune   ANESTHESIA:   general  EBL:  Total I/O In: 1000 [I.V.:1000] Out: -   BLOOD ADMINISTERED:none  DRAINS: (1) Hemovact drain(s) in the r knee with  Suction Open   LOCAL MEDICATIONS USED:  MARCAINE     SPECIMEN:  No Specimen  DISPOSITION OF SPECIMEN:  N/A  COUNTS:  YES  TOURNIQUET:   Total Tourniquet Time Documented: Thigh (Right) - 70 minutes Total: Thigh (Right) - 70 minutes   DICTATION: .Other Dictation: Dictation Number [id number]  PLAN OF CARE: Admit to inpatient   PATIENT DISPOSITION:  PACU - hemodynamically stable.   Delay start of Pharmacological VTE agent (>24hrs) due to surgical blood loss or risk of bleeding: no
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
## 2013-03-08 NOTE — Preoperative (Signed)
Beta Blockers   Reason not to administer Beta Blockers:Not Applicable 

## 2013-03-09 ENCOUNTER — Encounter (HOSPITAL_COMMUNITY): Payer: Self-pay | Admitting: General Practice

## 2013-03-09 LAB — BASIC METABOLIC PANEL
BUN: 15 mg/dL (ref 6–23)
CO2: 27 meq/L (ref 19–32)
Calcium: 8.8 mg/dL (ref 8.4–10.5)
Chloride: 99 mEq/L (ref 96–112)
Creatinine, Ser: 0.63 mg/dL (ref 0.50–1.10)
GFR calc Af Amer: >90 mL/min (ref >90)
GFR calc non Af Amer: >90 mL/min (ref >90)
GLUCOSE: 126 mg/dL — AB (ref 70–99)
Potassium: 4.4 mEq/L (ref 3.7–5.3)
SODIUM: 137 meq/L (ref 137–147)

## 2013-03-09 LAB — CBC
HCT: 33.7 % — ABNORMAL LOW (ref 36.0–46.0)
HEMOGLOBIN: 11 g/dL — AB (ref 12.0–15.0)
MCH: 28 pg (ref 26.0–34.0)
MCHC: 32.6 g/dL (ref 30.0–36.0)
MCV: 85.8 fL (ref 78.0–100.0)
Platelets: 256 10*3/uL (ref 150–400)
RBC: 3.93 MIL/uL (ref 3.87–5.11)
RDW: 14 % (ref 11.5–15.5)
WBC: 15.3 10*3/uL — ABNORMAL HIGH (ref 4.0–10.5)

## 2013-03-09 MED ORDER — ASPIRIN EC 325 MG PO TBEC
325.0000 mg | DELAYED_RELEASE_TABLET | Freq: Two times a day (BID) | ORAL | Status: DC
  Administered 2013-03-09 – 2013-03-10 (×3): 325 mg via ORAL
  Filled 2013-03-09 (×5): qty 1

## 2013-03-09 MED ORDER — ONDANSETRON HCL 4 MG/2ML IJ SOLN: 4.0000 mg | Freq: Four times a day (QID) | INTRAMUSCULAR | Status: DC | PRN

## 2013-03-09 MED ORDER — ACETAMINOPHEN 650 MG RE SUPP: 650.0000 mg | Freq: Four times a day (QID) | RECTAL | Status: DC | PRN

## 2013-03-09 MED ORDER — LORATADINE 10 MG PO TABS
10.0000 mg | ORAL_TABLET | Freq: Every day | ORAL | Status: DC
  Administered 2013-03-09: 10 mg via ORAL
  Filled 2013-03-09 (×2): qty 1

## 2013-03-09 MED ORDER — DIPHENHYDRAMINE HCL 12.5 MG/5ML PO ELIX: 12.5000 mg | ORAL_SOLUTION | ORAL | Status: DC | PRN

## 2013-03-09 MED ORDER — METHOCARBAMOL 750 MG PO TABS: 750.0000 mg | ORAL_TABLET | Freq: Three times a day (TID) | ORAL | Status: AC | PRN

## 2013-03-09 MED ORDER — BISACODYL 10 MG RE SUPP: 10.0000 mg | Freq: Every day | RECTAL | Status: DC | PRN

## 2013-03-09 MED ORDER — POLYETHYLENE GLYCOL 3350 17 G PO PACK: 17.0000 g | PACK | Freq: Every day | ORAL | Status: DC | PRN

## 2013-03-09 MED ORDER — PROMETHAZINE HCL 25 MG/ML IJ SOLN
12.5000 mg | Freq: Four times a day (QID) | INTRAMUSCULAR | Status: DC | PRN
  Filled 2013-03-09: qty 1

## 2013-03-09 MED ORDER — ACETAMINOPHEN 325 MG PO TABS: 650.0000 mg | ORAL_TABLET | Freq: Four times a day (QID) | ORAL | Status: DC | PRN

## 2013-03-09 MED ORDER — SODIUM CHLORIDE 0.9 % IV SOLN
INTRAVENOUS | Status: DC
  Administered 2013-03-09 (×2): via INTRAVENOUS
  Administered 2013-03-09: 100 mL/h via INTRAVENOUS

## 2013-03-09 MED ORDER — ONDANSETRON HCL 4 MG PO TABS: 4.0000 mg | ORAL_TABLET | Freq: Four times a day (QID) | ORAL | Status: DC | PRN

## 2013-03-09 MED ORDER — PROMETHAZINE HCL 25 MG PO TABS
25.0000 mg | ORAL_TABLET | Freq: Four times a day (QID) | ORAL | Status: DC | PRN
  Administered 2013-03-09 – 2013-03-10 (×3): 25 mg via ORAL
  Filled 2013-03-09 (×3): qty 1

## 2013-03-09 MED ORDER — ALUM & MAG HYDROXIDE-SIMETH 200-200-20 MG/5ML PO SUSP
30.0000 mL | ORAL | Status: DC | PRN
Start: 2013-03-09 — End: 2013-03-10

## 2013-03-09 MED ORDER — OXYCODONE-ACETAMINOPHEN 5-325 MG PO TABS: 1.0000 | ORAL_TABLET | Freq: Four times a day (QID) | ORAL | Status: AC | PRN

## 2013-03-09 MED ORDER — ASPIRIN EC 325 MG PO TBEC: 325.0000 mg | DELAYED_RELEASE_TABLET | Freq: Two times a day (BID) | ORAL | Status: AC

## 2013-03-09 NOTE — Care Management Note (Signed)
  Page 2 of 2   03/10/2013     10:38:33 AM   CARE MANAGEMENT NOTE 03/10/2013  Patient:  Kaitlyn House,Kaitlyn House   Account Number:  [id number]  Date Initiated:  03/09/2013  Documentation initiated by:  Ronny FlurryWILE,Crissy Mccreadie  Subjective/Objective Assessment:     Action/Plan:   Anticipated DC Date:  03/10/2013   Anticipated DC Plan:  HOME W HOME HEALTH SERVICES         Choice offered to / List presented to:  C-1 Patient   DME arranged  CPM      DME agency  [employer]     HH arranged  HH-2 PT  HH[facility]3 OT      [facility]   Status of service:   Medicare Important Message given?   (If response is "NO", the following Medicare IM given date fields will be blank) Date Medicare IM given:   Date Additional Medicare IM given:    Discharge Disposition:    Per UR Regulation:    If discussed at Long Length of Stay Meetings, dates discussed:    Comments:  03-10-13 Conf[phone] , referral receive and patient will receive first HHPT visit 03-11-13 . Faxed discharge summary to Clydie BraunKaren at [phone] . Ronny FlurryHeather Tiphany Fayson RN BSN [phone]    03-09-13 [facility] and would like [facility] again , requesting [facility]Maria [facility] . Ref[facility]ally [facility] , patient will be seen at home on Thursday 03-11-13 .   Patient has walker 3 in 1 from previous knee surgery.   Orders faxed to [facility] at [phone] , [facility] intake phone number [phone]  Ronny FlurryHeather Naleah Kofoed RN BSN [phone]

## 2013-03-09 NOTE — Evaluation (Signed)
Physical Therapy Evaluation Patient Details Name: Kaitlyn House MRN: [id number] DOB: [birth year] Today's Date: 03/09/2013 Time: 0910-0940 PT Time Calculation (min): 30 min  PT Assessment / Plan / Recommendation History of Present Illness  pt s/p R TKA 03/08/13  Clinical Impression  Pt presents with decreased mobility, gait, strength and ROM.  Will benefit from skilled acute PT to address deficits and increase functional independence.    PT Assessment  Patient needs continued PT services    Follow Up Recommendations  Home health PT    Does the patient have the potential to tolerate intense rehabilitation      Barriers to Discharge        Equipment Recommendations  None recommended by PT    Recommendations for Other Services     Frequency 7X/week    Precautions / Restrictions Precautions Precautions: Knee Required Braces or Orthoses: Knee Immobilizer - Right Knee Immobilizer - Right: Discontinue once straight leg raise with < 10 degree lag;On when out of bed or walking Restrictions RLE Weight Bearing: Weight bearing as tolerated   Pertinent Vitals/Pain Pt c/o pain when LE in dependent position, eases when elevated      Mobility  Bed Mobility Bed Mobility: Supine to Sit Supine to Sit: 4: Min assist Details for Bed Mobility Assistance: assist to move R LE Transfers Transfers: Sit to Stand;Stand to Sit;Stand Pivot Transfers Sit to Stand: 5: Supervision Stand to Sit: 5: Supervision Stand Pivot Transfers: 5: Supervision Details for Transfer Assistance: pt able to perform sit to stand and SPT to [facility] with supervision, no cues for hand placement on RW needed Ambulation/Gait Ambulation/Gait Assistance: 5: Supervision Ambulation Distance (Feet): 10 Feet Assistive device: Rolling walker Ambulation/Gait Assistance Details: step to pattern, no LOB    Exercises Total Joint Exercises Ankle Circles/Pumps: AROM;Both;20 reps Quad Sets: AROM;Right;10 reps   PT Diagnosis:  Difficulty walking;Generalized weakness  PT Problem List: Decreased mobility;Decreased strength;Decreased range of motion;Decreased activity tolerance PT Treatment Interventions: Functional mobility training;Therapeutic activities;Patient/family education;Neuromuscular re-education;Stair training;Gait training;Modalities;Balance training;DME instruction;Therapeutic exercise;Wheelchair mobility training     PT Goals(Current goals can be found in the care plan section) Acute Rehab PT Goals Patient Stated Goal: go home tomorrow PT Goal Formulation: With patient Time For Goal Achievement: 03/12/13 Potential to Achieve Goals: Good  Visit Information  Last PT Received On: 03/09/13 Assistance Needed: +1 History of Present Illness: pt s/p R TKA 03/08/13       Prior Functioning  Home Living Family/patient expects to be discharged to:: Private residence Living Arrangements: Spouse/significant other;Children Available Help at Discharge: Family Type of Home: House Home Access: Stairs to enter Secretary/administrator of Steps: 2 Entrance Stairs-Rails: None Home Layout: Multi-level Alternate Level Stairs-Number of Steps: 5 Home Equipment: Walker - 2 wheels;Bedside commode Prior Function Level of Independence: Independent Communication Communication: No difficulties    Cognition  Cognition Arousal/Alertness: Awake/alert Behavior During Therapy: WFL for tasks assessed/performed Overall Cognitive Status: Within Functional Limits for tasks assessed    Extremity/Trunk Assessment Upper Extremity Assessment Upper Extremity Assessment: Overall WFL for tasks assessed Lower Extremity Assessment Lower Extremity Assessment: RLE deficits/detail RLE Deficits / Details: R LE in bandages, CPM is set for 0-60 RLE: Unable to fully assess due to immobilization Cervical / Trunk Assessment Cervical / Trunk Assessment: Normal   Balance    End of Session PT - End of Session Equipment Utilized During  Treatment: Right knee immobilizer Activity Tolerance: Patient tolerated treatment well Patient left: in chair;with call bell/phone within reach Nurse Communication: Mobility status  GP     Kaitlyn House 03/09/2013, 11:34 AM

## 2013-03-09 NOTE — Op Note (Signed)
[facility]:  Pilot, Kaitlyn House              ACCOUNT NO.:  [id number]  MEDICAL RECORD NO.:  [id number]  LOCATION:  [id number]                        FACILITY:  [facility]  PHYSICIAN:  Harvie JuniorJohn L. Leily Capek, M.D.   DATE OF BIRTH:  1955/09/29  DATE OF PROCEDURE:  03/08/2013 DATE OF DISCHARGE:                              OPERATIVE REPORT   PREOPERATIVE DIAGNOSIS: 1. End-stage degenerative joint disease, right knee. 2. Morbid obesity.  POSTOPERATIVE DIAGNOSIS: 1. End-stage degenerative joint disease, right knee. 2. Morbid obesity.  PRINCIPAL PROCEDURES:  Right total knee replacement with a Sigma system, size 3 femur, size 3 MBT revision style tray, 12.5 mm bridging bearing, and a 35 mm all polyethylene patella.  SURGEON:  Harvie JuniorJohn L. Demetric Parslow, M.D.  ANESTHESIA:  General.  BRIEF HISTORY:  Mrs. Kaitlyn House is a 58 year old female with long history of having significant complaints of her bilateral knee pain.  She had left total knee a couple of years ago and after failure of all conservative care in terms of her right knee, she has come for right total knee replacement.  Her x-rays showed bone-on-bone change.  She has had significant night pain for many months.  After failure of all conservative care, she was taken to the operating room for right total knee replacement.  Because of her morbid obesity, we felt that we needed to use an additional support on the tibial side and an MBT revision tray was chosen to use preoperatively of the knee description and additional help because of her large size.  DESCRIPTION OF PROCEDURE:  The patient was taken to the operating room and after adequate anesthesia was obtained with general anesthetic, the patient was placed supine on the operating table.  The right leg was then prepped and draped in usual sterile fashion.  Following this, the leg was exsanguinated and blood pressure tourniquet inflated to 350 mmHg.  At this point, a midline incision was made.  After the leg  was exsanguinated and blood pressure tourniquet was inflated to 350 mmHg, the subcutaneous tissue dissected down to the level of the extensor mechanism, and a medial parapatellar arthrotomy was undertaken.  Once this was done, the medial and lateral meniscus removed.  Retropatellar fat pad and synovium in the anterior aspect of the femur, anterior and posterior cruciates.  Once this was done, retractors were put in place. The tibia was exposed and cut perpendicular to the long axis of the tibia with 3 degrees of posterior slope for an MBT-cell revision tray. Attention turned to the femur where an intramedullary pilot hole was drilled and then the rotational alignment was set with a 5-degree valgus inclination.  The distal femur was cut 10 mm off the distal femur.  The rotational alignment was set, then the anterior and posterior cuts were made, chamfers and box, femur size was 3-1/2.  We checked __________initially, but there was nothing being taken off the back, went down to a 3 and posterior __________ the component by 2 mm, then anterior and posterior cuts were made, chamfers and box.  Attention was then turned to the tibia, sized to a 3, MBT style revision tray was chosen and was drilled and keeled, and the trials were put in  place, 12.5 bearing was placed which gave excellent full extension and great mid-range stability.  Attention was then turned to the patella and cut down to a level of 14 mm.  A 35 all poly trial was chosen and put in place and the knee was put through a range of motion, excellent stability and range of motion was achieved.  At this point, all trials were removed.  The knee was then copiously and thoroughly lavaged and suctioned dry.  The final components were then cemented into place, size 3 femur, size 3 MBT revision style tray, a 12.5 mm bearing trial was placed and a 35 all poly patella was placed and put in with a clamp. Once that was done, the all cements  were allowed to harden.  All excess bone cements were removed.  Once the cement was completely hardened, then tourniquet __________ down, trial poly was removed with look in the back for excess cement products, once this was achieved, then attention was turned towards the placement of the final poly __________ which shows a trial 15, trial was just too tight, could not relieve, getting at end of the knee and went back with a __________ poly.  Final one was placed.  The medium Hemovac drain was placed.  The medial parapatellar arthrotomy was closed with 1 Vicryl running, the skin with  2-0 Vicryl sutures and skin staples.  Sterile compressive dressing was applied and the patient was taken to recovery room and noted to be in satisfactory condition.  Estimated blood loss for the procedure was less than 100 mL.  Because of the patient's large size, significant additional retraction was necessary, each step was much more difficult, achieving an alignment was chosen and to make sure that we could __________visualize for each step was taken.  The total amount of time in the case was about one and half times that taken normally because of the patient's large size.     Harvie Junior, M.D.     Ranae Plumber  D:  03/08/2013  T:  03/09/2013  Job:  [id number]

## 2013-03-09 NOTE — Progress Notes (Signed)
Physical Therapy Treatment Patient Details Name: Kaitlyn DunHelen G Cihlar MRN: [id number] DOB: June 23, 1955 Today's Date: 03/09/2013 Time: [id number] PT Time Calculation (min): 30 min  PT Assessment / Plan / Recommendation  History of Present Illness pt s/p R TKA 03/08/13   PT Comments   Pt tolerated treatment well, needs no physical assist with gait or sit to stands.  Pt only has increased pain when lifting R LE to place it in CPM.  Follow Up Recommendations  Home health PT     Does the patient have the potential to tolerate intense rehabilitation     Barriers to Discharge        Equipment Recommendations  None recommended by PT    Recommendations for Other Services    Frequency 7X/week   Progress towards PT Goals Progress towards PT goals: Progressing toward goals  Plan Current plan remains appropriate    Precautions / Restrictions Precautions Precautions: Knee Required Braces or Orthoses: Knee Immobilizer - Right Knee Immobilizer - Right: Discontinue once straight leg raise with < 10 degree lag;On when out of bed or walking Restrictions RLE Weight Bearing: Weight bearing as tolerated   Pertinent Vitals/Pain C/o pain after placing knee in CPM, pt states she already had pain meds and does not want any more at this time   Mobility  Bed Mobility Bed Mobility: Sit to Supine Supine to Sit: 5: Supervision Sit to Supine: 4: Min assist Details for Bed Mobility Assistance: min A to lift R LE into bed, pt states after last knee surgery she used a cane to hook and lift her LE into bed Transfers Transfers: Sit to Stand;Stand to Sit;Stand Pivot Transfers Sit to Stand: 5: Supervision Stand to Sit: 5: Supervision Stand Pivot Transfers: 5: Supervision Details for Transfer Assistance: pt able to sit to stand from bed and toilet without physical assist Ambulation/Gait Ambulation/Gait Assistance: 5: Supervision Ambulation Distance (Feet): 100 Feet Assistive device: Rolling walker Ambulation/Gait  Assistance Details: step to pattern, cues for step through    Exercises Total Joint Exercises Ankle Circles/Pumps: AROM;20 reps;Both Quad Sets: AROM;Right;10 reps Heel Slides: AAROM;Right;10 reps   PT Diagnosis: Difficulty walking;Generalized weakness  PT Problem List: Decreased mobility;Decreased strength;Decreased range of motion;Decreased activity tolerance PT Treatment Interventions: Functional mobility training;Therapeutic activities;Patient/family education;Neuromuscular re-education;Stair training;Gait training;Modalities;Balance training;DME instruction;Therapeutic exercise;Wheelchair mobility training   PT Goals (current goals can now be found in the care plan section) Acute Rehab PT Goals Patient Stated Goal: go home tomorrow PT Goal Formulation: With patient Time For Goal Achievement: 03/12/13 Potential to Achieve Goals: Good  Visit Information  Last PT Received On: 03/09/13 Assistance Needed: +1 History of Present Illness: pt s/p R TKA 03/08/13    Subjective Data  Patient Stated Goal: go home tomorrow   Cognition  Cognition Arousal/Alertness: Awake/alert Behavior During Therapy: WFL for tasks assessed/performed Overall Cognitive Status: Within Functional Limits for tasks assessed    Balance     End of Session PT - End of Session Equipment Utilized During Treatment: Right knee immobilizer Activity Tolerance: Patient tolerated treatment well Patient left: in bed;in CPM;with call bell/phone within reach;with family/visitor present Nurse Communication: Mobility status CPM Right Knee CPM Right Knee: On Right Knee Flexion (Degrees): 60 Right Knee Extension (Degrees): 0   GP     DONAWERTH,KAREN 03/09/2013, 2:15 PM

## 2013-03-09 NOTE — Progress Notes (Signed)
Subjective: 1 Day Post-Op Procedure(s) (LRB): TOTAL KNEE ARTHROPLASTY (Right) Patient reports pain as 5 on 0-10 scale.   Some nausea with pain meds. Taking po fluids/foley out this am.  Objective: Vital signs in last 24 hours: Temp:  [97.3 F (36.3 C)-98.4 F (36.9 C)] 98 F (36.7 C) (01/06 0551) Pulse Rate:  [60-103] 63 (01/06 0551) Resp:  [10-22] 17 (01/06 0551) BP: (128-162)/(68-97) 128/68 mmHg (01/06 0551) SpO2:  [91 %-100 %] 96 % (01/06 0551) Weight:  [118 kg (260 lb 2.3 oz)] 118 kg (260 lb 2.3 oz) (01/06 0015)  Intake/Output from previous day: 01/05 0701 - 01/06 0700 In: 2134 [P.O.:240; I.V.:1894] Out: 1390 [Urine:1225; Drains:115; Blood:50] Intake/Output this shift:     Recent Labs  03/09/13 0535  HGB 11.0*    Recent Labs  03/09/13 0535  WBC 15.3*  RBC 3.93  HCT 33.7*  PLT 256    Recent Labs  03/09/13 0535  NA 137  K 4.4  CL 99  CO2 27  BUN 15  CREATININE 0.63  GLUCOSE 126*  CALCIUM 8.8   Right knee exam: Neurovascular intact Sensation intact distally Intact pulses distally Dorsiflexion/Plantar flexion intact Incision: dressing C/D/I Compartment soft  Assessment/Plan: 1 Day Post-Op Procedure(s) (LRB): TOTAL KNEE ARTHROPLASTY (Right) Plan: Drain pulled. Phenergan ordered to use with pain meds as needed for nausea. Up with therapy Plan for discharge tomorrow Cont ASA/scd's for DVT prophylaxis.  Zorion Nims G 03/09/2013, 9:26 AM

## 2013-03-10 ENCOUNTER — Encounter (HOSPITAL_COMMUNITY): Payer: Self-pay | Admitting: Orthopedic Surgery

## 2013-03-10 LAB — CBC
HCT: 28.8 % — ABNORMAL LOW (ref 36.0–46.0)
Hemoglobin: 9.8 g/dL — ABNORMAL LOW (ref 12.0–15.0)
MCH: 28.8 pg (ref 26.0–34.0)
MCHC: 34 g/dL (ref 30.0–36.0)
MCV: 84.7 fL (ref 78.0–100.0)
Platelets: 223 10*3/uL (ref 150–400)
RBC: 3.4 MIL/uL — ABNORMAL LOW (ref 3.87–5.11)
RDW: 14.4 % (ref 11.5–15.5)
WBC: 14.7 10*3/uL — ABNORMAL HIGH (ref 4.0–10.5)

## 2013-03-10 MED ORDER — PROMETHAZINE HCL 25 MG PO TABS: 25.0000 mg | ORAL_TABLET | Freq: Four times a day (QID) | ORAL | Status: AC | PRN

## 2013-03-10 NOTE — Progress Notes (Signed)
Subjective: 2 Days Post-Op Procedure(s) (LRB): TOTAL KNEE ARTHROPLASTY (Right) Patient reports pain as 3 on 0-10 scale.  Good progress with PT. Taking po/voiding ok. Ready to go home.   Objective: Vital signs in last 24 hours: Temp:  [97.7 F (36.5 C)-98.7 F (37.1 C)] 98.1 F (36.7 C) (01/07 0552) Pulse Rate:  [72-95] 95 (01/07 0552) Resp:  [16-20] 20 (01/07 0552) BP: (106-140)/(57-80) 140/80 mmHg (01/07 0552) SpO2:  [93 %-98 %] 97 % (01/07 0552)  Intake/Output from previous day: 01/06 0701 - 01/07 0700 In: 2036.7 [P.O.:480; I.V.:1556.7] Out: 1425 [Urine:1425] Intake/Output this shift:     Recent Labs  03/09/13 0535 03/10/13 0520  HGB 11.0* 9.8*    Recent Labs  03/09/13 0535 03/10/13 0520  WBC 15.3* 14.7*  RBC 3.93 3.40*  HCT 33.7* 28.8*  PLT 256 223    Recent Labs  03/09/13 0535  NA 137  K 4.4  CL 99  CO2 27  BUN 15  CREATININE 0.63  GLUCOSE 126*  CALCIUM 8.8   Right knee exam: Neurovascular intact Sensation intact distally Intact pulses distally Dorsiflexion/Plantar flexion intact Incision: no drainage Compartment soft  Assessment/Plan: 2 Days Post-Op Procedure(s) (LRB): TOTAL KNEE ARTHROPLASTY (Right) Plan: Dressing changed by nursing staff. Discharge home with home health  Terie Lear G 03/10/2013, 8:27 AM
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
## 2013-03-10 NOTE — Discharge Instructions (Signed)
Total Knee Replacement °Care After °Refer to this sheet in the next few weeks. These instructions provide you with information on caring for yourself after your procedure. Your caregiver also may give you specific instructions. Your treatment has been planned according to the most current medical practices, but problems sometimes occur. Call your caregiver if you have any problems or questions after your procedure. °HOME CARE INSTRUCTIONS  °· See a physical therapist as directed by your caregiver. °· Take over-the-counter or prescription medicines for pain, discomfort, or fever only as directed by your caregiver. °· Avoid lifting or driving until you are instructed otherwise. °· If you have been sent home with a continuous passive motion machine, use it as directed by your caregiver. °SEEK MEDICAL CARE IF: °· You have difficulty breathing. °· Your wound is red, swollen, or has become increasingly painful. °· You have pus draining from your wound. °· You have a bad smell coming from your wound. °· You have persistent bleeding from your wound. °· Your wound breaks open after sutures (stitches) or staples have been removed. °SEEK IMMEDIATE MEDICAL CARE IF:  °· You have a fever. °· You have a rash. °· You have pain or swelling in your calf or thigh. °· You have shortness of breath or chest pain. °· Your range of motion in your knee is decreasing rather than increasing. °MAKE SURE YOU:  °· Understand these instructions. °· Will watch your condition. °· Will get help right away if you are not doing well or get worse. °Document Released: 09/07/2004 Document Revised: 08/20/2011 Document Reviewed: 04/09/2011 °[employer]® Patient Information ©2014 [employer]. ° °

## 2013-03-10 NOTE — Progress Notes (Signed)
Physical Therapy Treatment Patient Details Name: Kaitlyn DunHelen G House MRN: [id number] DOB: 10/19/1955 Today's Date: 03/10/2013 Time: 0755-0820 PT Time Calculation (min): 25 min  PT Assessment / Plan / Recommendation  History of Present Illness pt s/p R TKA 03/08/13   PT Comments   Pt able to gait increased distance today and perform stairs at supervision level. Pt given handout of HEP for home strengthening and ROM.  Pt ready for d/c home today.  Follow Up Recommendations  Home health PT     Does the patient have the potential to tolerate intense rehabilitation     Barriers to Discharge        Equipment Recommendations  None recommended by PT    Recommendations for Other Services    Frequency 7X/week   Progress towards PT Goals Progress towards PT goals: Progressing toward goals  Plan Current plan remains appropriate    Precautions / Restrictions Precautions Precautions: Knee Required Braces or Orthoses: Knee Immobilizer - Right Knee Immobilizer - Right: Discontinue once straight leg raise with < 10 degree lag;On when out of bed or walking Restrictions RLE Weight Bearing: Weight bearing as tolerated   Pertinent Vitals/Pain Pt c/o pain 8/10 R knee after session, RN aware    Mobility  Ambulation/Gait Ambulation Distance (Feet): 200 Feet Gait velocity: slow Number of Stairs: 3 with supervision with L rail Pt mod I with bed mobility, transfers and gait in controlled environment and bathroom/room with obstacle negotaition and toilet/reclincer transfers.   Exercises  pt given handout of HEP for home therex   PT Diagnosis:    PT Problem List:   PT Treatment Interventions:     PT Goals (current goals can now be found in the care plan section)    Visit Information  Last PT Received On: 03/10/13 Assistance Needed: +1 History of Present Illness: pt s/p R TKA 03/08/13    Subjective Data      Cognition  Cognition Arousal/Alertness: Awake/alert Behavior During Therapy: [facility]  for tasks assessed/performed Overall Cognitive Status: Within Functional Limits for tasks assessed    Balance     End of Session PT - End of Session Equipment Utilized During Treatment: Right knee immobilizer Activity Tolerance: Patient tolerated treatment well Patient left: in chair;with call bell/phone within reach;with family/visitor present Nurse Communication: Mobility status   GP     Kaitlyn House 03/10/2013, 11:04 AM

## 2015-06-15 IMAGING — CR DG CHEST 2V
2 series · 2 of 2 positions shown · non-contrast
Comparison: 03/29/2011

CLINICAL DATA: Preoperative evaluation for knee replacement

EXAM:
CHEST  2 VIEW

[w chest pa]
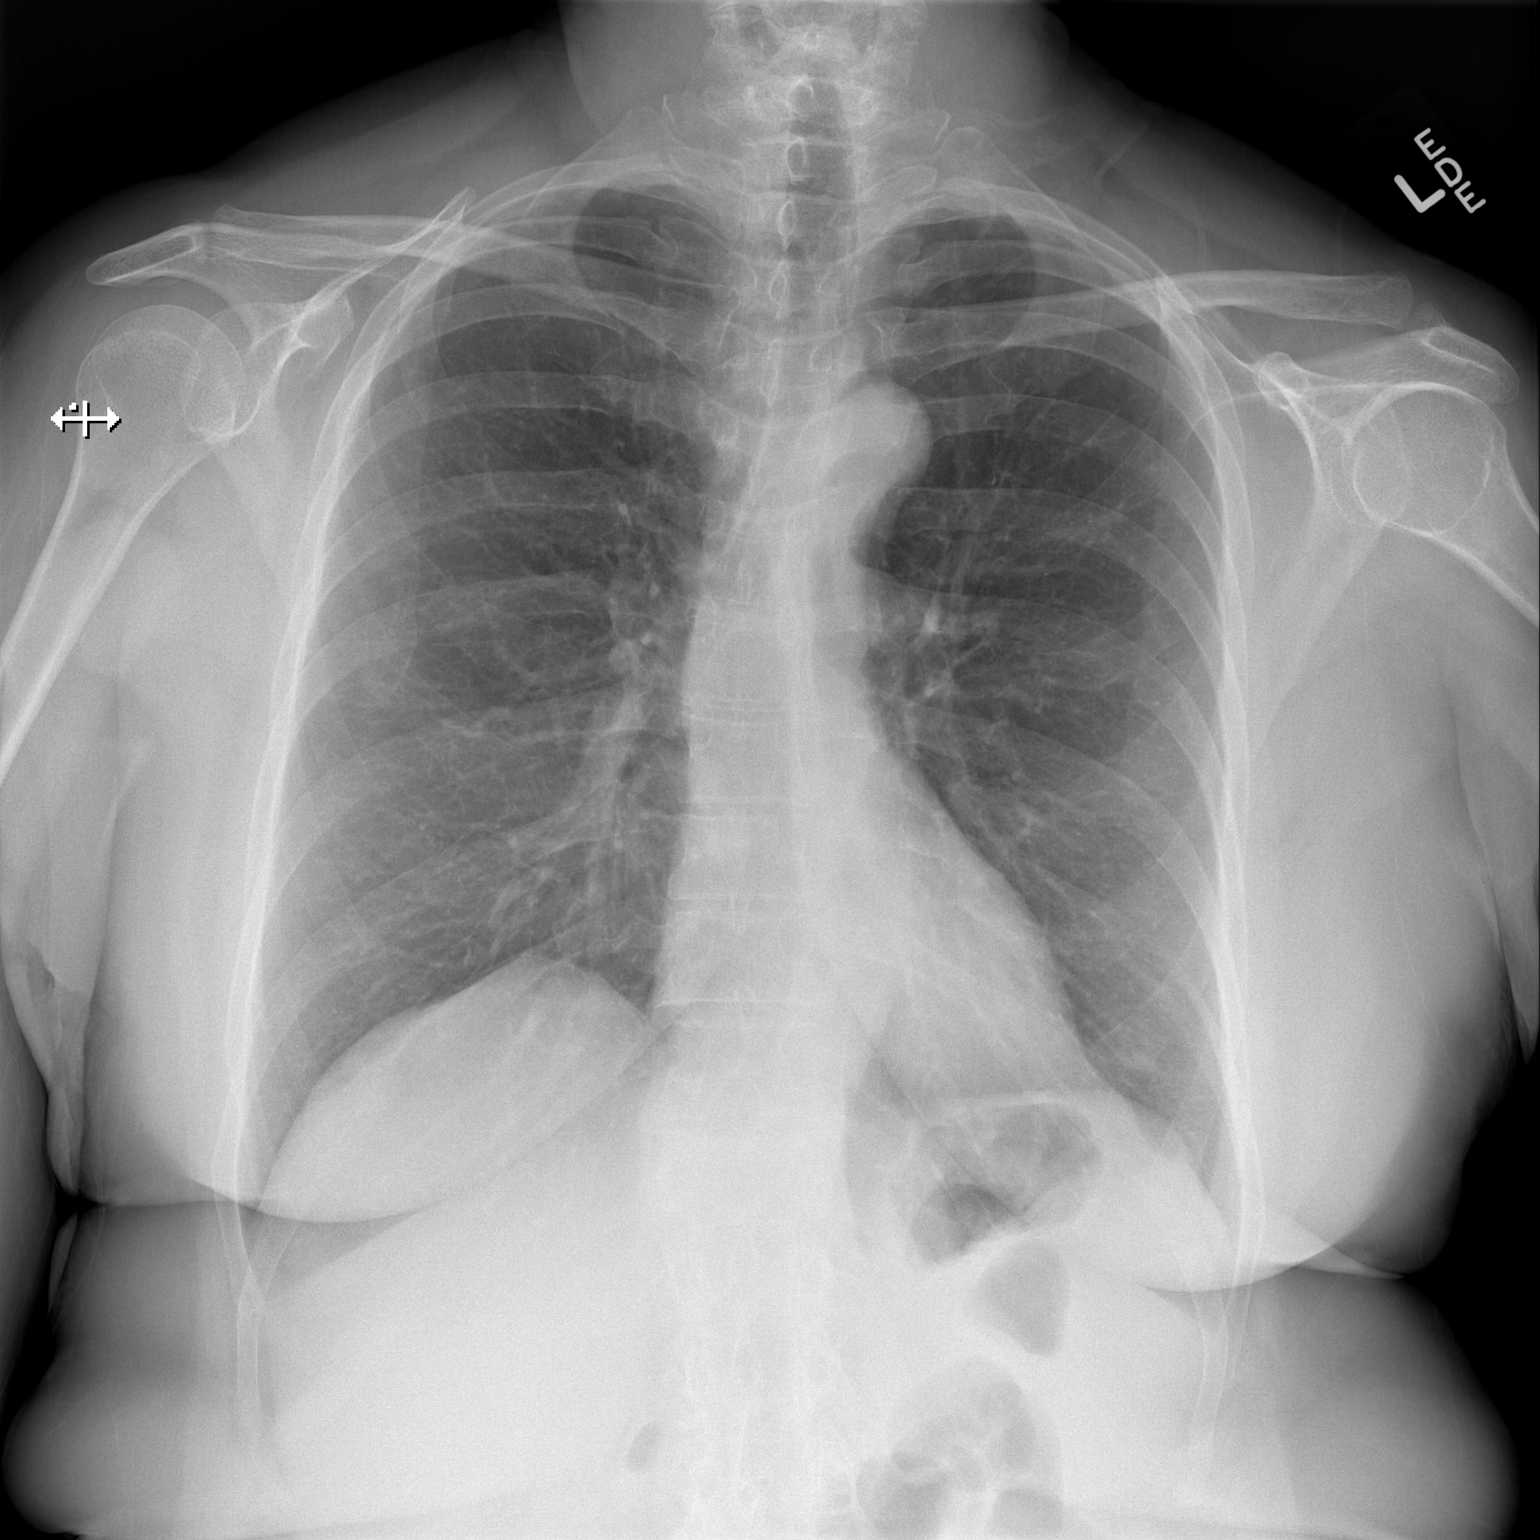

[w chest lat]
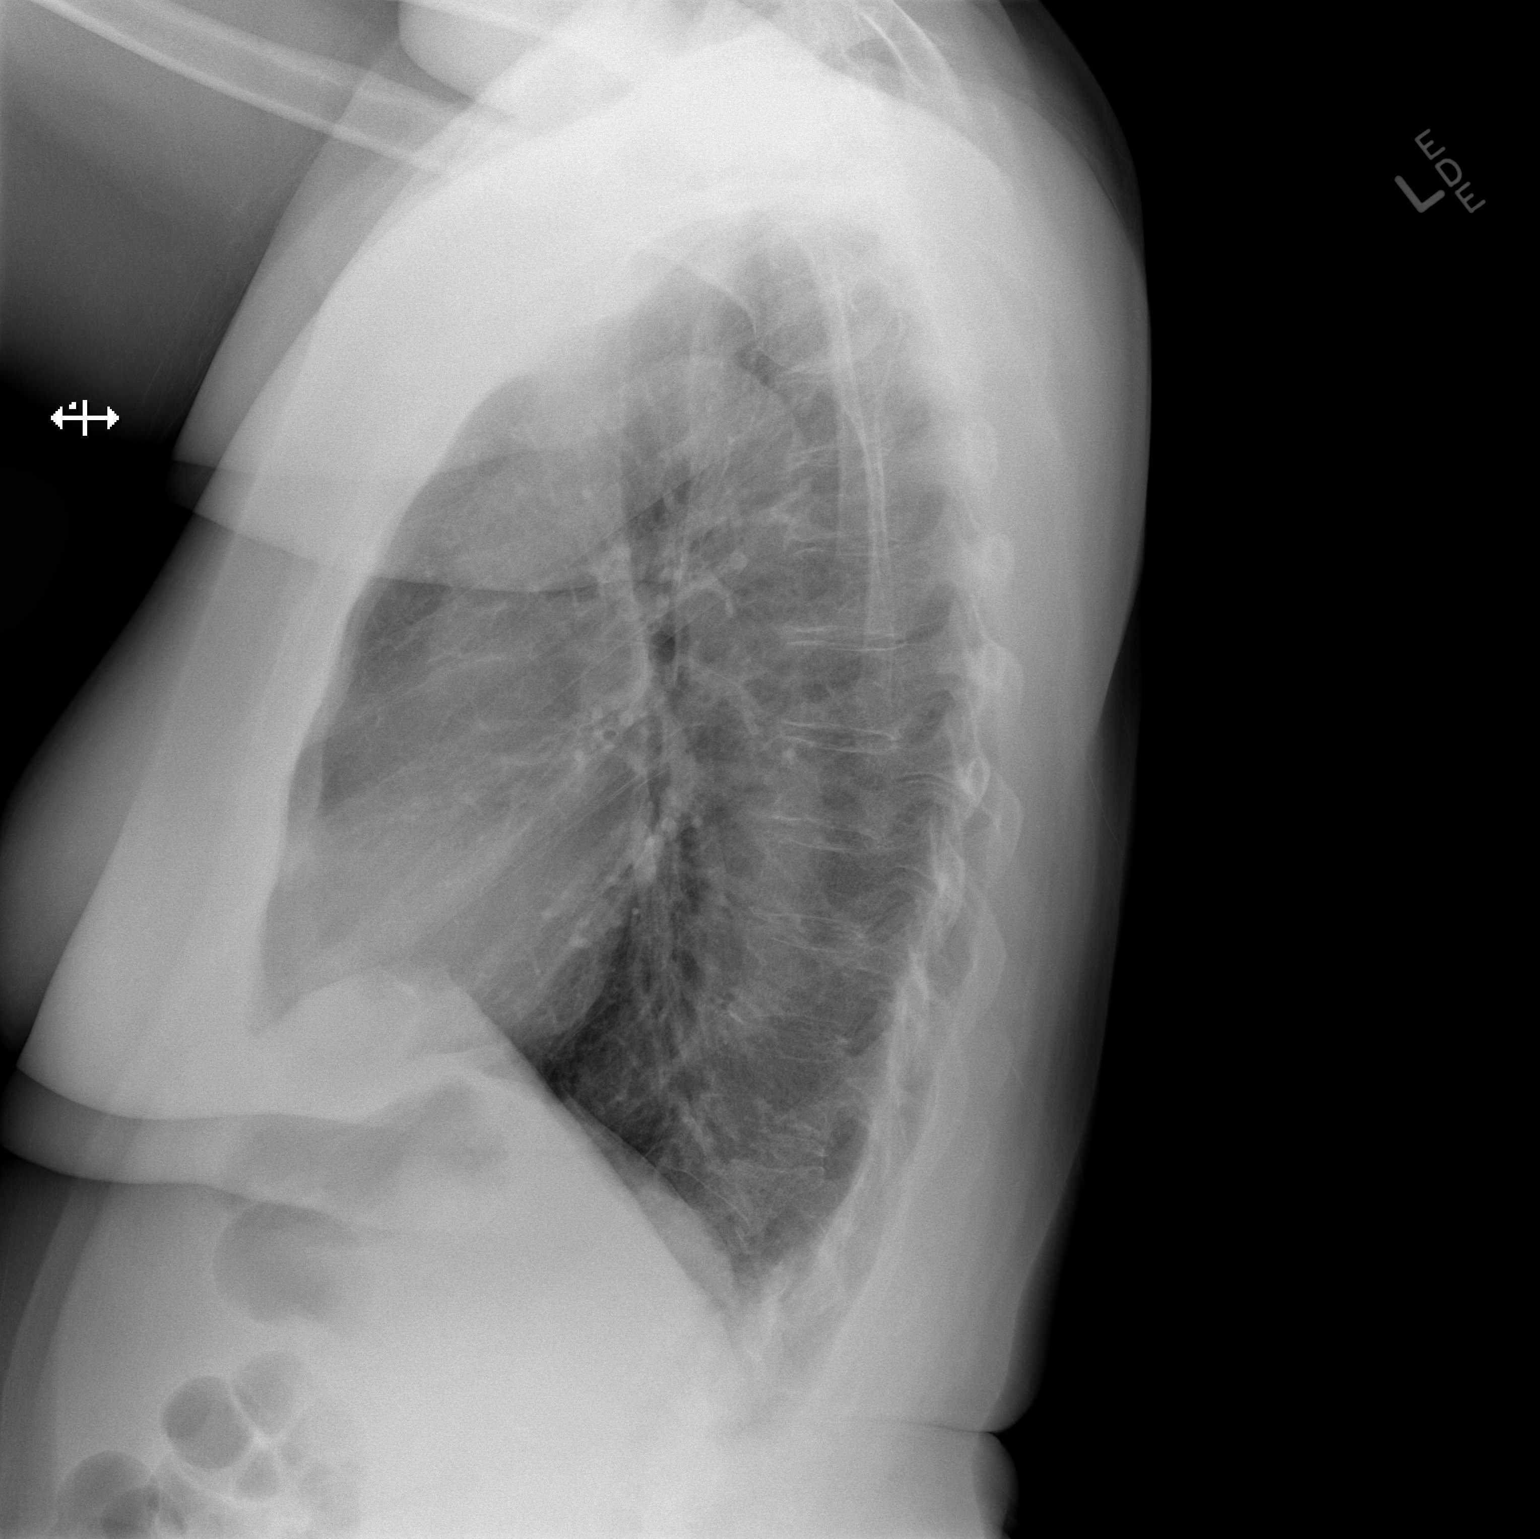

[2 of 2 positions shown; findings below may reference images not displayed]

FINDINGS: The heart size and mediastinal contours are within normal limits.
Both lungs are clear. The visualized skeletal structures are
unremarkable.
IMPRESSION: No active cardiopulmonary disease.

## 2020-05-02 DIAGNOSIS — H9313 Tinnitus, bilateral: Secondary | ICD-10-CM | POA: Diagnosis not present

## 2020-06-22 DIAGNOSIS — M7061 Trochanteric bursitis, right hip: Secondary | ICD-10-CM | POA: Diagnosis not present

## 2020-07-04 DIAGNOSIS — R319 Hematuria, unspecified: Secondary | ICD-10-CM | POA: Diagnosis not present

## 2020-07-04 DIAGNOSIS — R109 Unspecified abdominal pain: Secondary | ICD-10-CM | POA: Diagnosis not present

## 2020-07-11 ENCOUNTER — Other Ambulatory Visit: Payer: Self-pay | Admitting: Family Medicine

## 2020-07-11 DIAGNOSIS — R109 Unspecified abdominal pain: Secondary | ICD-10-CM

## 2020-07-11 DIAGNOSIS — R319 Hematuria, unspecified: Secondary | ICD-10-CM

## 2020-07-26 ENCOUNTER — Ambulatory Visit
Admission: RE | Admit: 2020-07-26 | Discharge: 2020-07-26 | Disposition: A | Discharge status: Home / Self Care | Payer: Medicare PPO | Source: Ambulatory Visit | Attending: Family Medicine | Admitting: Family Medicine

## 2020-07-26 ENCOUNTER — Other Ambulatory Visit: Payer: Self-pay

## 2020-07-26 DIAGNOSIS — R319 Hematuria, unspecified: Secondary | ICD-10-CM

## 2020-07-26 DIAGNOSIS — R109 Unspecified abdominal pain: Secondary | ICD-10-CM

## 2020-07-26 DIAGNOSIS — K449 Diaphragmatic hernia without obstruction or gangrene: Secondary | ICD-10-CM | POA: Diagnosis not present

## 2020-09-06 DIAGNOSIS — R310 Gross hematuria: Secondary | ICD-10-CM | POA: Diagnosis not present

## 2020-09-08 DIAGNOSIS — R319 Hematuria, unspecified: Secondary | ICD-10-CM | POA: Diagnosis not present

## 2020-09-08 DIAGNOSIS — K5730 Diverticulosis of large intestine without perforation or abscess without bleeding: Secondary | ICD-10-CM | POA: Diagnosis not present

## 2020-09-08 DIAGNOSIS — K449 Diaphragmatic hernia without obstruction or gangrene: Secondary | ICD-10-CM | POA: Diagnosis not present

## 2020-09-08 DIAGNOSIS — R310 Gross hematuria: Secondary | ICD-10-CM | POA: Diagnosis not present

## 2020-09-19 DIAGNOSIS — M545 Low back pain, unspecified: Secondary | ICD-10-CM | POA: Diagnosis not present

## 2020-09-19 DIAGNOSIS — M9902 Segmental and somatic dysfunction of thoracic region: Secondary | ICD-10-CM | POA: Diagnosis not present

## 2020-09-19 DIAGNOSIS — M542 Cervicalgia: Secondary | ICD-10-CM | POA: Diagnosis not present

## 2020-09-19 DIAGNOSIS — M9903 Segmental and somatic dysfunction of lumbar region: Secondary | ICD-10-CM | POA: Diagnosis not present

## 2020-09-19 DIAGNOSIS — M9905 Segmental and somatic dysfunction of pelvic region: Secondary | ICD-10-CM | POA: Diagnosis not present

## 2020-09-19 DIAGNOSIS — M9901 Segmental and somatic dysfunction of cervical region: Secondary | ICD-10-CM | POA: Diagnosis not present

## 2020-09-19 DIAGNOSIS — M546 Pain in thoracic spine: Secondary | ICD-10-CM | POA: Diagnosis not present

## 2020-09-27 DIAGNOSIS — M9905 Segmental and somatic dysfunction of pelvic region: Secondary | ICD-10-CM | POA: Diagnosis not present

## 2020-09-27 DIAGNOSIS — M9902 Segmental and somatic dysfunction of thoracic region: Secondary | ICD-10-CM | POA: Diagnosis not present

## 2020-09-27 DIAGNOSIS — M9901 Segmental and somatic dysfunction of cervical region: Secondary | ICD-10-CM | POA: Diagnosis not present

## 2020-09-27 DIAGNOSIS — M545 Low back pain, unspecified: Secondary | ICD-10-CM | POA: Diagnosis not present

## 2020-09-27 DIAGNOSIS — M9903 Segmental and somatic dysfunction of lumbar region: Secondary | ICD-10-CM | POA: Diagnosis not present

## 2020-09-27 DIAGNOSIS — M542 Cervicalgia: Secondary | ICD-10-CM | POA: Diagnosis not present

## 2020-09-27 DIAGNOSIS — M546 Pain in thoracic spine: Secondary | ICD-10-CM | POA: Diagnosis not present

## 2020-09-28 DIAGNOSIS — M9905 Segmental and somatic dysfunction of pelvic region: Secondary | ICD-10-CM | POA: Diagnosis not present

## 2020-09-28 DIAGNOSIS — M545 Low back pain, unspecified: Secondary | ICD-10-CM | POA: Diagnosis not present

## 2020-09-28 DIAGNOSIS — M9901 Segmental and somatic dysfunction of cervical region: Secondary | ICD-10-CM | POA: Diagnosis not present

## 2020-09-28 DIAGNOSIS — M546 Pain in thoracic spine: Secondary | ICD-10-CM | POA: Diagnosis not present

## 2020-09-28 DIAGNOSIS — M9903 Segmental and somatic dysfunction of lumbar region: Secondary | ICD-10-CM | POA: Diagnosis not present

## 2020-09-28 DIAGNOSIS — M9902 Segmental and somatic dysfunction of thoracic region: Secondary | ICD-10-CM | POA: Diagnosis not present

## 2020-09-28 DIAGNOSIS — M542 Cervicalgia: Secondary | ICD-10-CM | POA: Diagnosis not present

## 2020-10-03 DIAGNOSIS — M546 Pain in thoracic spine: Secondary | ICD-10-CM | POA: Diagnosis not present

## 2020-10-03 DIAGNOSIS — M9902 Segmental and somatic dysfunction of thoracic region: Secondary | ICD-10-CM | POA: Diagnosis not present

## 2020-10-03 DIAGNOSIS — M545 Low back pain, unspecified: Secondary | ICD-10-CM | POA: Diagnosis not present

## 2020-10-03 DIAGNOSIS — M9903 Segmental and somatic dysfunction of lumbar region: Secondary | ICD-10-CM | POA: Diagnosis not present

## 2020-10-03 DIAGNOSIS — M542 Cervicalgia: Secondary | ICD-10-CM | POA: Diagnosis not present

## 2020-10-03 DIAGNOSIS — M9901 Segmental and somatic dysfunction of cervical region: Secondary | ICD-10-CM | POA: Diagnosis not present

## 2020-10-03 DIAGNOSIS — M9905 Segmental and somatic dysfunction of pelvic region: Secondary | ICD-10-CM | POA: Diagnosis not present

## 2020-10-12 DIAGNOSIS — M9901 Segmental and somatic dysfunction of cervical region: Secondary | ICD-10-CM | POA: Diagnosis not present

## 2020-10-12 DIAGNOSIS — M9903 Segmental and somatic dysfunction of lumbar region: Secondary | ICD-10-CM | POA: Diagnosis not present

## 2020-10-12 DIAGNOSIS — M9902 Segmental and somatic dysfunction of thoracic region: Secondary | ICD-10-CM | POA: Diagnosis not present

## 2020-10-12 DIAGNOSIS — M9905 Segmental and somatic dysfunction of pelvic region: Secondary | ICD-10-CM | POA: Diagnosis not present

## 2020-10-12 DIAGNOSIS — M542 Cervicalgia: Secondary | ICD-10-CM | POA: Diagnosis not present

## 2020-10-12 DIAGNOSIS — M545 Low back pain, unspecified: Secondary | ICD-10-CM | POA: Diagnosis not present

## 2020-10-12 DIAGNOSIS — M546 Pain in thoracic spine: Secondary | ICD-10-CM | POA: Diagnosis not present

## 2020-10-16 DIAGNOSIS — R319 Hematuria, unspecified: Secondary | ICD-10-CM | POA: Diagnosis not present

## 2020-10-17 DIAGNOSIS — R310 Gross hematuria: Secondary | ICD-10-CM | POA: Diagnosis not present

## 2020-10-17 DIAGNOSIS — R319 Hematuria, unspecified: Secondary | ICD-10-CM | POA: Diagnosis not present

## 2020-10-27 DIAGNOSIS — M542 Cervicalgia: Secondary | ICD-10-CM | POA: Diagnosis not present

## 2020-10-27 DIAGNOSIS — M545 Low back pain, unspecified: Secondary | ICD-10-CM | POA: Diagnosis not present

## 2020-10-27 DIAGNOSIS — M9905 Segmental and somatic dysfunction of pelvic region: Secondary | ICD-10-CM | POA: Diagnosis not present

## 2020-10-27 DIAGNOSIS — M9901 Segmental and somatic dysfunction of cervical region: Secondary | ICD-10-CM | POA: Diagnosis not present

## 2020-10-27 DIAGNOSIS — M9903 Segmental and somatic dysfunction of lumbar region: Secondary | ICD-10-CM | POA: Diagnosis not present

## 2020-10-27 DIAGNOSIS — M9902 Segmental and somatic dysfunction of thoracic region: Secondary | ICD-10-CM | POA: Diagnosis not present

## 2020-10-27 DIAGNOSIS — M546 Pain in thoracic spine: Secondary | ICD-10-CM | POA: Diagnosis not present

## 2020-11-22 DIAGNOSIS — M545 Low back pain, unspecified: Secondary | ICD-10-CM | POA: Diagnosis not present

## 2020-11-22 DIAGNOSIS — M9901 Segmental and somatic dysfunction of cervical region: Secondary | ICD-10-CM | POA: Diagnosis not present

## 2020-11-22 DIAGNOSIS — M546 Pain in thoracic spine: Secondary | ICD-10-CM | POA: Diagnosis not present

## 2020-11-22 DIAGNOSIS — M542 Cervicalgia: Secondary | ICD-10-CM | POA: Diagnosis not present

## 2020-11-22 DIAGNOSIS — M9902 Segmental and somatic dysfunction of thoracic region: Secondary | ICD-10-CM | POA: Diagnosis not present

## 2020-11-22 DIAGNOSIS — M9905 Segmental and somatic dysfunction of pelvic region: Secondary | ICD-10-CM | POA: Diagnosis not present

## 2020-11-22 DIAGNOSIS — M9903 Segmental and somatic dysfunction of lumbar region: Secondary | ICD-10-CM | POA: Diagnosis not present

## 2020-12-06 DIAGNOSIS — M546 Pain in thoracic spine: Secondary | ICD-10-CM | POA: Diagnosis not present

## 2020-12-06 DIAGNOSIS — M542 Cervicalgia: Secondary | ICD-10-CM | POA: Diagnosis not present

## 2020-12-06 DIAGNOSIS — M461 Sacroiliitis, not elsewhere classified: Secondary | ICD-10-CM | POA: Diagnosis not present

## 2020-12-06 DIAGNOSIS — M545 Low back pain, unspecified: Secondary | ICD-10-CM | POA: Diagnosis not present

## 2020-12-06 DIAGNOSIS — M9905 Segmental and somatic dysfunction of pelvic region: Secondary | ICD-10-CM | POA: Diagnosis not present

## 2020-12-06 DIAGNOSIS — M9901 Segmental and somatic dysfunction of cervical region: Secondary | ICD-10-CM | POA: Diagnosis not present

## 2020-12-06 DIAGNOSIS — M9902 Segmental and somatic dysfunction of thoracic region: Secondary | ICD-10-CM | POA: Diagnosis not present

## 2020-12-06 DIAGNOSIS — M9903 Segmental and somatic dysfunction of lumbar region: Secondary | ICD-10-CM | POA: Diagnosis not present

## 2020-12-15 DIAGNOSIS — M461 Sacroiliitis, not elsewhere classified: Secondary | ICD-10-CM | POA: Diagnosis not present

## 2020-12-15 DIAGNOSIS — M9905 Segmental and somatic dysfunction of pelvic region: Secondary | ICD-10-CM | POA: Diagnosis not present

## 2020-12-15 DIAGNOSIS — M9903 Segmental and somatic dysfunction of lumbar region: Secondary | ICD-10-CM | POA: Diagnosis not present

## 2020-12-15 DIAGNOSIS — M545 Low back pain, unspecified: Secondary | ICD-10-CM | POA: Diagnosis not present

## 2020-12-15 DIAGNOSIS — M546 Pain in thoracic spine: Secondary | ICD-10-CM | POA: Diagnosis not present

## 2020-12-15 DIAGNOSIS — M9901 Segmental and somatic dysfunction of cervical region: Secondary | ICD-10-CM | POA: Diagnosis not present

## 2020-12-15 DIAGNOSIS — M542 Cervicalgia: Secondary | ICD-10-CM | POA: Diagnosis not present

## 2020-12-15 DIAGNOSIS — M9902 Segmental and somatic dysfunction of thoracic region: Secondary | ICD-10-CM | POA: Diagnosis not present

## 2021-01-03 DIAGNOSIS — M545 Low back pain, unspecified: Secondary | ICD-10-CM | POA: Diagnosis not present

## 2021-01-03 DIAGNOSIS — M546 Pain in thoracic spine: Secondary | ICD-10-CM | POA: Diagnosis not present

## 2021-01-03 DIAGNOSIS — M9903 Segmental and somatic dysfunction of lumbar region: Secondary | ICD-10-CM | POA: Diagnosis not present

## 2021-01-03 DIAGNOSIS — M542 Cervicalgia: Secondary | ICD-10-CM | POA: Diagnosis not present

## 2021-01-03 DIAGNOSIS — M9901 Segmental and somatic dysfunction of cervical region: Secondary | ICD-10-CM | POA: Diagnosis not present

## 2021-01-03 DIAGNOSIS — M461 Sacroiliitis, not elsewhere classified: Secondary | ICD-10-CM | POA: Diagnosis not present

## 2021-01-03 DIAGNOSIS — M9905 Segmental and somatic dysfunction of pelvic region: Secondary | ICD-10-CM | POA: Diagnosis not present

## 2021-01-03 DIAGNOSIS — M9902 Segmental and somatic dysfunction of thoracic region: Secondary | ICD-10-CM | POA: Diagnosis not present

## 2021-01-04 DIAGNOSIS — Z1231 Encounter for screening mammogram for malignant neoplasm of breast: Secondary | ICD-10-CM | POA: Diagnosis not present

## 2021-01-17 DIAGNOSIS — M461 Sacroiliitis, not elsewhere classified: Secondary | ICD-10-CM | POA: Diagnosis not present

## 2021-01-17 DIAGNOSIS — M545 Low back pain, unspecified: Secondary | ICD-10-CM | POA: Diagnosis not present

## 2021-01-17 DIAGNOSIS — M9901 Segmental and somatic dysfunction of cervical region: Secondary | ICD-10-CM | POA: Diagnosis not present

## 2021-01-17 DIAGNOSIS — M9902 Segmental and somatic dysfunction of thoracic region: Secondary | ICD-10-CM | POA: Diagnosis not present

## 2021-01-17 DIAGNOSIS — M9903 Segmental and somatic dysfunction of lumbar region: Secondary | ICD-10-CM | POA: Diagnosis not present

## 2021-01-17 DIAGNOSIS — M546 Pain in thoracic spine: Secondary | ICD-10-CM | POA: Diagnosis not present

## 2021-01-17 DIAGNOSIS — M9905 Segmental and somatic dysfunction of pelvic region: Secondary | ICD-10-CM | POA: Diagnosis not present

## 2021-01-17 DIAGNOSIS — M542 Cervicalgia: Secondary | ICD-10-CM | POA: Diagnosis not present

## 2021-01-19 DIAGNOSIS — R928 Other abnormal and inconclusive findings on diagnostic imaging of breast: Secondary | ICD-10-CM | POA: Diagnosis not present

## 2021-01-19 DIAGNOSIS — R922 Inconclusive mammogram: Secondary | ICD-10-CM | POA: Diagnosis not present

## 2021-01-19 DIAGNOSIS — N6002 Solitary cyst of left breast: Secondary | ICD-10-CM | POA: Diagnosis not present

## 2021-01-19 DIAGNOSIS — N6321 Unspecified lump in the left breast, upper outer quadrant: Secondary | ICD-10-CM | POA: Diagnosis not present

## 2021-01-22 DIAGNOSIS — J309 Allergic rhinitis, unspecified: Secondary | ICD-10-CM | POA: Diagnosis not present

## 2021-01-22 DIAGNOSIS — D509 Iron deficiency anemia, unspecified: Secondary | ICD-10-CM | POA: Diagnosis not present

## 2021-01-22 DIAGNOSIS — Z23 Encounter for immunization: Secondary | ICD-10-CM | POA: Diagnosis not present

## 2021-01-22 DIAGNOSIS — L409 Psoriasis, unspecified: Secondary | ICD-10-CM | POA: Diagnosis not present

## 2021-01-22 DIAGNOSIS — K219 Gastro-esophageal reflux disease without esophagitis: Secondary | ICD-10-CM | POA: Diagnosis not present

## 2021-01-22 DIAGNOSIS — K449 Diaphragmatic hernia without obstruction or gangrene: Secondary | ICD-10-CM | POA: Diagnosis not present

## 2021-01-22 DIAGNOSIS — I700 Atherosclerosis of aorta: Secondary | ICD-10-CM | POA: Diagnosis not present

## 2021-01-22 DIAGNOSIS — Z0000 Encounter for general adult medical examination without abnormal findings: Secondary | ICD-10-CM | POA: Diagnosis not present

## 2021-01-30 DIAGNOSIS — M9903 Segmental and somatic dysfunction of lumbar region: Secondary | ICD-10-CM | POA: Diagnosis not present

## 2021-01-30 DIAGNOSIS — M542 Cervicalgia: Secondary | ICD-10-CM | POA: Diagnosis not present

## 2021-01-30 DIAGNOSIS — M461 Sacroiliitis, not elsewhere classified: Secondary | ICD-10-CM | POA: Diagnosis not present

## 2021-01-30 DIAGNOSIS — M546 Pain in thoracic spine: Secondary | ICD-10-CM | POA: Diagnosis not present

## 2021-01-30 DIAGNOSIS — M9902 Segmental and somatic dysfunction of thoracic region: Secondary | ICD-10-CM | POA: Diagnosis not present

## 2021-01-30 DIAGNOSIS — M545 Low back pain, unspecified: Secondary | ICD-10-CM | POA: Diagnosis not present

## 2021-01-30 DIAGNOSIS — M9905 Segmental and somatic dysfunction of pelvic region: Secondary | ICD-10-CM | POA: Diagnosis not present

## 2021-01-30 DIAGNOSIS — M9901 Segmental and somatic dysfunction of cervical region: Secondary | ICD-10-CM | POA: Diagnosis not present

## 2021-02-13 DIAGNOSIS — M546 Pain in thoracic spine: Secondary | ICD-10-CM | POA: Diagnosis not present

## 2021-02-13 DIAGNOSIS — M545 Low back pain, unspecified: Secondary | ICD-10-CM | POA: Diagnosis not present

## 2021-02-13 DIAGNOSIS — M542 Cervicalgia: Secondary | ICD-10-CM | POA: Diagnosis not present

## 2021-02-13 DIAGNOSIS — M9901 Segmental and somatic dysfunction of cervical region: Secondary | ICD-10-CM | POA: Diagnosis not present

## 2021-02-13 DIAGNOSIS — M9905 Segmental and somatic dysfunction of pelvic region: Secondary | ICD-10-CM | POA: Diagnosis not present

## 2021-02-13 DIAGNOSIS — M9903 Segmental and somatic dysfunction of lumbar region: Secondary | ICD-10-CM | POA: Diagnosis not present

## 2021-02-13 DIAGNOSIS — M461 Sacroiliitis, not elsewhere classified: Secondary | ICD-10-CM | POA: Diagnosis not present

## 2021-02-13 DIAGNOSIS — M9902 Segmental and somatic dysfunction of thoracic region: Secondary | ICD-10-CM | POA: Diagnosis not present

## 2021-03-06 DIAGNOSIS — U071 COVID-19: Secondary | ICD-10-CM | POA: Diagnosis not present

## 2021-03-20 DIAGNOSIS — M546 Pain in thoracic spine: Secondary | ICD-10-CM | POA: Diagnosis not present

## 2021-03-20 DIAGNOSIS — M9905 Segmental and somatic dysfunction of pelvic region: Secondary | ICD-10-CM | POA: Diagnosis not present

## 2021-03-20 DIAGNOSIS — M542 Cervicalgia: Secondary | ICD-10-CM | POA: Diagnosis not present

## 2021-03-20 DIAGNOSIS — M9901 Segmental and somatic dysfunction of cervical region: Secondary | ICD-10-CM | POA: Diagnosis not present

## 2021-03-20 DIAGNOSIS — M545 Low back pain, unspecified: Secondary | ICD-10-CM | POA: Diagnosis not present

## 2021-03-20 DIAGNOSIS — M461 Sacroiliitis, not elsewhere classified: Secondary | ICD-10-CM | POA: Diagnosis not present

## 2021-03-20 DIAGNOSIS — M9903 Segmental and somatic dysfunction of lumbar region: Secondary | ICD-10-CM | POA: Diagnosis not present

## 2021-03-20 DIAGNOSIS — M9902 Segmental and somatic dysfunction of thoracic region: Secondary | ICD-10-CM | POA: Diagnosis not present

## 2021-04-17 DIAGNOSIS — M9903 Segmental and somatic dysfunction of lumbar region: Secondary | ICD-10-CM | POA: Diagnosis not present

## 2021-04-17 DIAGNOSIS — M545 Low back pain, unspecified: Secondary | ICD-10-CM | POA: Diagnosis not present

## 2021-04-17 DIAGNOSIS — M542 Cervicalgia: Secondary | ICD-10-CM | POA: Diagnosis not present

## 2021-04-17 DIAGNOSIS — M9905 Segmental and somatic dysfunction of pelvic region: Secondary | ICD-10-CM | POA: Diagnosis not present

## 2021-04-17 DIAGNOSIS — M9901 Segmental and somatic dysfunction of cervical region: Secondary | ICD-10-CM | POA: Diagnosis not present

## 2021-04-17 DIAGNOSIS — M9902 Segmental and somatic dysfunction of thoracic region: Secondary | ICD-10-CM | POA: Diagnosis not present

## 2021-04-17 DIAGNOSIS — M461 Sacroiliitis, not elsewhere classified: Secondary | ICD-10-CM | POA: Diagnosis not present

## 2021-04-17 DIAGNOSIS — M546 Pain in thoracic spine: Secondary | ICD-10-CM | POA: Diagnosis not present

## 2021-05-15 DIAGNOSIS — M545 Low back pain, unspecified: Secondary | ICD-10-CM | POA: Diagnosis not present

## 2021-05-15 DIAGNOSIS — M546 Pain in thoracic spine: Secondary | ICD-10-CM | POA: Diagnosis not present

## 2021-05-15 DIAGNOSIS — M9905 Segmental and somatic dysfunction of pelvic region: Secondary | ICD-10-CM | POA: Diagnosis not present

## 2021-05-15 DIAGNOSIS — M9902 Segmental and somatic dysfunction of thoracic region: Secondary | ICD-10-CM | POA: Diagnosis not present

## 2021-05-15 DIAGNOSIS — M9903 Segmental and somatic dysfunction of lumbar region: Secondary | ICD-10-CM | POA: Diagnosis not present

## 2021-05-15 DIAGNOSIS — M9901 Segmental and somatic dysfunction of cervical region: Secondary | ICD-10-CM | POA: Diagnosis not present

## 2021-05-15 DIAGNOSIS — M542 Cervicalgia: Secondary | ICD-10-CM | POA: Diagnosis not present

## 2021-05-15 DIAGNOSIS — M461 Sacroiliitis, not elsewhere classified: Secondary | ICD-10-CM | POA: Diagnosis not present

## 2021-06-05 DIAGNOSIS — L814 Other melanin hyperpigmentation: Secondary | ICD-10-CM | POA: Diagnosis not present

## 2021-06-05 DIAGNOSIS — L821 Other seborrheic keratosis: Secondary | ICD-10-CM | POA: Diagnosis not present

## 2021-06-05 DIAGNOSIS — L820 Inflamed seborrheic keratosis: Secondary | ICD-10-CM | POA: Diagnosis not present

## 2021-06-05 DIAGNOSIS — L298 Other pruritus: Secondary | ICD-10-CM | POA: Diagnosis not present

## 2021-06-05 DIAGNOSIS — D235 Other benign neoplasm of skin of trunk: Secondary | ICD-10-CM | POA: Diagnosis not present

## 2021-06-13 DIAGNOSIS — M461 Sacroiliitis, not elsewhere classified: Secondary | ICD-10-CM | POA: Diagnosis not present

## 2021-06-13 DIAGNOSIS — M9901 Segmental and somatic dysfunction of cervical region: Secondary | ICD-10-CM | POA: Diagnosis not present

## 2021-06-13 DIAGNOSIS — M9903 Segmental and somatic dysfunction of lumbar region: Secondary | ICD-10-CM | POA: Diagnosis not present

## 2021-06-13 DIAGNOSIS — M9905 Segmental and somatic dysfunction of pelvic region: Secondary | ICD-10-CM | POA: Diagnosis not present

## 2021-06-13 DIAGNOSIS — M542 Cervicalgia: Secondary | ICD-10-CM | POA: Diagnosis not present

## 2021-06-13 DIAGNOSIS — M545 Low back pain, unspecified: Secondary | ICD-10-CM | POA: Diagnosis not present

## 2021-06-13 DIAGNOSIS — M546 Pain in thoracic spine: Secondary | ICD-10-CM | POA: Diagnosis not present

## 2021-06-13 DIAGNOSIS — M9902 Segmental and somatic dysfunction of thoracic region: Secondary | ICD-10-CM | POA: Diagnosis not present

## 2021-06-27 DIAGNOSIS — M9902 Segmental and somatic dysfunction of thoracic region: Secondary | ICD-10-CM | POA: Diagnosis not present

## 2021-06-27 DIAGNOSIS — M9905 Segmental and somatic dysfunction of pelvic region: Secondary | ICD-10-CM | POA: Diagnosis not present

## 2021-06-27 DIAGNOSIS — M542 Cervicalgia: Secondary | ICD-10-CM | POA: Diagnosis not present

## 2021-06-27 DIAGNOSIS — M9901 Segmental and somatic dysfunction of cervical region: Secondary | ICD-10-CM | POA: Diagnosis not present

## 2021-06-27 DIAGNOSIS — M9903 Segmental and somatic dysfunction of lumbar region: Secondary | ICD-10-CM | POA: Diagnosis not present

## 2021-06-27 DIAGNOSIS — M461 Sacroiliitis, not elsewhere classified: Secondary | ICD-10-CM | POA: Diagnosis not present

## 2021-06-27 DIAGNOSIS — M546 Pain in thoracic spine: Secondary | ICD-10-CM | POA: Diagnosis not present

## 2021-06-27 DIAGNOSIS — M545 Low back pain, unspecified: Secondary | ICD-10-CM | POA: Diagnosis not present

## 2021-07-11 DIAGNOSIS — M545 Low back pain, unspecified: Secondary | ICD-10-CM | POA: Diagnosis not present

## 2021-07-11 DIAGNOSIS — M461 Sacroiliitis, not elsewhere classified: Secondary | ICD-10-CM | POA: Diagnosis not present

## 2021-07-11 DIAGNOSIS — M9903 Segmental and somatic dysfunction of lumbar region: Secondary | ICD-10-CM | POA: Diagnosis not present

## 2021-07-11 DIAGNOSIS — M9902 Segmental and somatic dysfunction of thoracic region: Secondary | ICD-10-CM | POA: Diagnosis not present

## 2021-07-11 DIAGNOSIS — M546 Pain in thoracic spine: Secondary | ICD-10-CM | POA: Diagnosis not present

## 2021-07-11 DIAGNOSIS — M542 Cervicalgia: Secondary | ICD-10-CM | POA: Diagnosis not present

## 2021-07-11 DIAGNOSIS — M9905 Segmental and somatic dysfunction of pelvic region: Secondary | ICD-10-CM | POA: Diagnosis not present

## 2021-07-11 DIAGNOSIS — M9901 Segmental and somatic dysfunction of cervical region: Secondary | ICD-10-CM | POA: Diagnosis not present

## 2021-08-01 DIAGNOSIS — M9905 Segmental and somatic dysfunction of pelvic region: Secondary | ICD-10-CM | POA: Diagnosis not present

## 2021-08-01 DIAGNOSIS — M461 Sacroiliitis, not elsewhere classified: Secondary | ICD-10-CM | POA: Diagnosis not present

## 2021-08-01 DIAGNOSIS — M9902 Segmental and somatic dysfunction of thoracic region: Secondary | ICD-10-CM | POA: Diagnosis not present

## 2021-08-01 DIAGNOSIS — M9903 Segmental and somatic dysfunction of lumbar region: Secondary | ICD-10-CM | POA: Diagnosis not present

## 2021-08-01 DIAGNOSIS — M545 Low back pain, unspecified: Secondary | ICD-10-CM | POA: Diagnosis not present

## 2021-08-01 DIAGNOSIS — M542 Cervicalgia: Secondary | ICD-10-CM | POA: Diagnosis not present

## 2021-08-01 DIAGNOSIS — M546 Pain in thoracic spine: Secondary | ICD-10-CM | POA: Diagnosis not present

## 2021-08-01 DIAGNOSIS — M9901 Segmental and somatic dysfunction of cervical region: Secondary | ICD-10-CM | POA: Diagnosis not present

## 2021-08-15 DIAGNOSIS — M545 Low back pain, unspecified: Secondary | ICD-10-CM | POA: Diagnosis not present

## 2021-08-15 DIAGNOSIS — M9903 Segmental and somatic dysfunction of lumbar region: Secondary | ICD-10-CM | POA: Diagnosis not present

## 2021-08-15 DIAGNOSIS — M461 Sacroiliitis, not elsewhere classified: Secondary | ICD-10-CM | POA: Diagnosis not present

## 2021-08-15 DIAGNOSIS — M542 Cervicalgia: Secondary | ICD-10-CM | POA: Diagnosis not present

## 2021-08-15 DIAGNOSIS — M546 Pain in thoracic spine: Secondary | ICD-10-CM | POA: Diagnosis not present

## 2021-08-15 DIAGNOSIS — M9901 Segmental and somatic dysfunction of cervical region: Secondary | ICD-10-CM | POA: Diagnosis not present

## 2021-08-15 DIAGNOSIS — M9902 Segmental and somatic dysfunction of thoracic region: Secondary | ICD-10-CM | POA: Diagnosis not present

## 2021-08-15 DIAGNOSIS — M9905 Segmental and somatic dysfunction of pelvic region: Secondary | ICD-10-CM | POA: Diagnosis not present

## 2021-08-29 DIAGNOSIS — M542 Cervicalgia: Secondary | ICD-10-CM | POA: Diagnosis not present

## 2021-08-29 DIAGNOSIS — M9902 Segmental and somatic dysfunction of thoracic region: Secondary | ICD-10-CM | POA: Diagnosis not present

## 2021-08-29 DIAGNOSIS — M546 Pain in thoracic spine: Secondary | ICD-10-CM | POA: Diagnosis not present

## 2021-08-29 DIAGNOSIS — M9901 Segmental and somatic dysfunction of cervical region: Secondary | ICD-10-CM | POA: Diagnosis not present

## 2021-08-29 DIAGNOSIS — M545 Low back pain, unspecified: Secondary | ICD-10-CM | POA: Diagnosis not present

## 2021-08-29 DIAGNOSIS — M461 Sacroiliitis, not elsewhere classified: Secondary | ICD-10-CM | POA: Diagnosis not present

## 2021-08-29 DIAGNOSIS — M9903 Segmental and somatic dysfunction of lumbar region: Secondary | ICD-10-CM | POA: Diagnosis not present

## 2021-08-29 DIAGNOSIS — M9905 Segmental and somatic dysfunction of pelvic region: Secondary | ICD-10-CM | POA: Diagnosis not present

## 2021-09-12 DIAGNOSIS — M9903 Segmental and somatic dysfunction of lumbar region: Secondary | ICD-10-CM | POA: Diagnosis not present

## 2021-09-12 DIAGNOSIS — M9905 Segmental and somatic dysfunction of pelvic region: Secondary | ICD-10-CM | POA: Diagnosis not present

## 2021-09-12 DIAGNOSIS — M9901 Segmental and somatic dysfunction of cervical region: Secondary | ICD-10-CM | POA: Diagnosis not present

## 2021-09-12 DIAGNOSIS — M9902 Segmental and somatic dysfunction of thoracic region: Secondary | ICD-10-CM | POA: Diagnosis not present

## 2021-09-12 DIAGNOSIS — M545 Low back pain, unspecified: Secondary | ICD-10-CM | POA: Diagnosis not present

## 2021-09-12 DIAGNOSIS — M546 Pain in thoracic spine: Secondary | ICD-10-CM | POA: Diagnosis not present

## 2021-09-12 DIAGNOSIS — M461 Sacroiliitis, not elsewhere classified: Secondary | ICD-10-CM | POA: Diagnosis not present

## 2021-09-12 DIAGNOSIS — M542 Cervicalgia: Secondary | ICD-10-CM | POA: Diagnosis not present

## 2021-09-13 DIAGNOSIS — M7918 Myalgia, other site: Secondary | ICD-10-CM | POA: Diagnosis not present

## 2021-09-13 DIAGNOSIS — M25551 Pain in right hip: Secondary | ICD-10-CM | POA: Diagnosis not present

## 2021-09-13 DIAGNOSIS — M5136 Other intervertebral disc degeneration, lumbar region: Secondary | ICD-10-CM | POA: Diagnosis not present

## 2021-09-26 DIAGNOSIS — M542 Cervicalgia: Secondary | ICD-10-CM | POA: Diagnosis not present

## 2021-09-26 DIAGNOSIS — M461 Sacroiliitis, not elsewhere classified: Secondary | ICD-10-CM | POA: Diagnosis not present

## 2021-09-26 DIAGNOSIS — M546 Pain in thoracic spine: Secondary | ICD-10-CM | POA: Diagnosis not present

## 2021-09-26 DIAGNOSIS — M545 Low back pain, unspecified: Secondary | ICD-10-CM | POA: Diagnosis not present

## 2021-09-26 DIAGNOSIS — M9901 Segmental and somatic dysfunction of cervical region: Secondary | ICD-10-CM | POA: Diagnosis not present

## 2021-09-26 DIAGNOSIS — M9902 Segmental and somatic dysfunction of thoracic region: Secondary | ICD-10-CM | POA: Diagnosis not present

## 2021-09-26 DIAGNOSIS — M9905 Segmental and somatic dysfunction of pelvic region: Secondary | ICD-10-CM | POA: Diagnosis not present

## 2021-09-26 DIAGNOSIS — M9903 Segmental and somatic dysfunction of lumbar region: Secondary | ICD-10-CM | POA: Diagnosis not present

## 2021-10-17 DIAGNOSIS — M9905 Segmental and somatic dysfunction of pelvic region: Secondary | ICD-10-CM | POA: Diagnosis not present

## 2021-10-17 DIAGNOSIS — M9901 Segmental and somatic dysfunction of cervical region: Secondary | ICD-10-CM | POA: Diagnosis not present

## 2021-10-17 DIAGNOSIS — M9902 Segmental and somatic dysfunction of thoracic region: Secondary | ICD-10-CM | POA: Diagnosis not present

## 2021-10-17 DIAGNOSIS — M9903 Segmental and somatic dysfunction of lumbar region: Secondary | ICD-10-CM | POA: Diagnosis not present

## 2021-10-17 DIAGNOSIS — M546 Pain in thoracic spine: Secondary | ICD-10-CM | POA: Diagnosis not present

## 2021-10-17 DIAGNOSIS — M542 Cervicalgia: Secondary | ICD-10-CM | POA: Diagnosis not present

## 2021-10-17 DIAGNOSIS — M461 Sacroiliitis, not elsewhere classified: Secondary | ICD-10-CM | POA: Diagnosis not present

## 2021-10-17 DIAGNOSIS — M545 Low back pain, unspecified: Secondary | ICD-10-CM | POA: Diagnosis not present

## 2021-11-01 DIAGNOSIS — M9901 Segmental and somatic dysfunction of cervical region: Secondary | ICD-10-CM | POA: Diagnosis not present

## 2021-11-01 DIAGNOSIS — M9903 Segmental and somatic dysfunction of lumbar region: Secondary | ICD-10-CM | POA: Diagnosis not present

## 2021-11-01 DIAGNOSIS — M461 Sacroiliitis, not elsewhere classified: Secondary | ICD-10-CM | POA: Diagnosis not present

## 2021-11-01 DIAGNOSIS — M9905 Segmental and somatic dysfunction of pelvic region: Secondary | ICD-10-CM | POA: Diagnosis not present

## 2021-11-01 DIAGNOSIS — M542 Cervicalgia: Secondary | ICD-10-CM | POA: Diagnosis not present

## 2021-11-01 DIAGNOSIS — M9902 Segmental and somatic dysfunction of thoracic region: Secondary | ICD-10-CM | POA: Diagnosis not present

## 2021-11-01 DIAGNOSIS — M546 Pain in thoracic spine: Secondary | ICD-10-CM | POA: Diagnosis not present

## 2021-11-01 DIAGNOSIS — M545 Low back pain, unspecified: Secondary | ICD-10-CM | POA: Diagnosis not present

## 2021-11-19 DIAGNOSIS — M25551 Pain in right hip: Secondary | ICD-10-CM | POA: Diagnosis not present

## 2021-11-19 DIAGNOSIS — M542 Cervicalgia: Secondary | ICD-10-CM | POA: Diagnosis not present

## 2021-11-19 DIAGNOSIS — M9903 Segmental and somatic dysfunction of lumbar region: Secondary | ICD-10-CM | POA: Diagnosis not present

## 2021-11-19 DIAGNOSIS — M9902 Segmental and somatic dysfunction of thoracic region: Secondary | ICD-10-CM | POA: Diagnosis not present

## 2021-11-19 DIAGNOSIS — M545 Low back pain, unspecified: Secondary | ICD-10-CM | POA: Diagnosis not present

## 2021-11-19 DIAGNOSIS — Z7689 Persons encountering health services in other specified circumstances: Secondary | ICD-10-CM | POA: Diagnosis not present

## 2021-11-19 DIAGNOSIS — M546 Pain in thoracic spine: Secondary | ICD-10-CM | POA: Diagnosis not present

## 2021-11-19 DIAGNOSIS — M461 Sacroiliitis, not elsewhere classified: Secondary | ICD-10-CM | POA: Diagnosis not present

## 2021-11-19 DIAGNOSIS — Z1211 Encounter for screening for malignant neoplasm of colon: Secondary | ICD-10-CM | POA: Diagnosis not present

## 2021-11-19 DIAGNOSIS — M9901 Segmental and somatic dysfunction of cervical region: Secondary | ICD-10-CM | POA: Diagnosis not present

## 2021-11-19 DIAGNOSIS — M9905 Segmental and somatic dysfunction of pelvic region: Secondary | ICD-10-CM | POA: Diagnosis not present

## 2021-12-03 DIAGNOSIS — M9905 Segmental and somatic dysfunction of pelvic region: Secondary | ICD-10-CM | POA: Diagnosis not present

## 2021-12-03 DIAGNOSIS — M9903 Segmental and somatic dysfunction of lumbar region: Secondary | ICD-10-CM | POA: Diagnosis not present

## 2021-12-03 DIAGNOSIS — M546 Pain in thoracic spine: Secondary | ICD-10-CM | POA: Diagnosis not present

## 2021-12-03 DIAGNOSIS — M9902 Segmental and somatic dysfunction of thoracic region: Secondary | ICD-10-CM | POA: Diagnosis not present

## 2021-12-03 DIAGNOSIS — M542 Cervicalgia: Secondary | ICD-10-CM | POA: Diagnosis not present

## 2021-12-03 DIAGNOSIS — M9901 Segmental and somatic dysfunction of cervical region: Secondary | ICD-10-CM | POA: Diagnosis not present

## 2021-12-03 DIAGNOSIS — M545 Low back pain, unspecified: Secondary | ICD-10-CM | POA: Diagnosis not present

## 2021-12-03 DIAGNOSIS — M461 Sacroiliitis, not elsewhere classified: Secondary | ICD-10-CM | POA: Diagnosis not present

## 2021-12-19 DIAGNOSIS — M461 Sacroiliitis, not elsewhere classified: Secondary | ICD-10-CM | POA: Diagnosis not present

## 2021-12-19 DIAGNOSIS — M545 Low back pain, unspecified: Secondary | ICD-10-CM | POA: Diagnosis not present

## 2021-12-19 DIAGNOSIS — M9902 Segmental and somatic dysfunction of thoracic region: Secondary | ICD-10-CM | POA: Diagnosis not present

## 2021-12-19 DIAGNOSIS — M9905 Segmental and somatic dysfunction of pelvic region: Secondary | ICD-10-CM | POA: Diagnosis not present

## 2021-12-19 DIAGNOSIS — M9901 Segmental and somatic dysfunction of cervical region: Secondary | ICD-10-CM | POA: Diagnosis not present

## 2021-12-19 DIAGNOSIS — M546 Pain in thoracic spine: Secondary | ICD-10-CM | POA: Diagnosis not present

## 2021-12-19 DIAGNOSIS — M542 Cervicalgia: Secondary | ICD-10-CM | POA: Diagnosis not present

## 2021-12-19 DIAGNOSIS — M9903 Segmental and somatic dysfunction of lumbar region: Secondary | ICD-10-CM | POA: Diagnosis not present

## 2022-01-02 DIAGNOSIS — M9902 Segmental and somatic dysfunction of thoracic region: Secondary | ICD-10-CM | POA: Diagnosis not present

## 2022-01-02 DIAGNOSIS — M9905 Segmental and somatic dysfunction of pelvic region: Secondary | ICD-10-CM | POA: Diagnosis not present

## 2022-01-02 DIAGNOSIS — M546 Pain in thoracic spine: Secondary | ICD-10-CM | POA: Diagnosis not present

## 2022-01-02 DIAGNOSIS — M9901 Segmental and somatic dysfunction of cervical region: Secondary | ICD-10-CM | POA: Diagnosis not present

## 2022-01-02 DIAGNOSIS — M542 Cervicalgia: Secondary | ICD-10-CM | POA: Diagnosis not present

## 2022-01-02 DIAGNOSIS — M9903 Segmental and somatic dysfunction of lumbar region: Secondary | ICD-10-CM | POA: Diagnosis not present

## 2022-01-02 DIAGNOSIS — M461 Sacroiliitis, not elsewhere classified: Secondary | ICD-10-CM | POA: Diagnosis not present

## 2022-01-02 DIAGNOSIS — M545 Low back pain, unspecified: Secondary | ICD-10-CM | POA: Diagnosis not present

## 2022-01-10 DIAGNOSIS — Z1231 Encounter for screening mammogram for malignant neoplasm of breast: Secondary | ICD-10-CM | POA: Diagnosis not present

## 2022-01-16 DIAGNOSIS — M9902 Segmental and somatic dysfunction of thoracic region: Secondary | ICD-10-CM | POA: Diagnosis not present

## 2022-01-16 DIAGNOSIS — M545 Low back pain, unspecified: Secondary | ICD-10-CM | POA: Diagnosis not present

## 2022-01-16 DIAGNOSIS — M9905 Segmental and somatic dysfunction of pelvic region: Secondary | ICD-10-CM | POA: Diagnosis not present

## 2022-01-16 DIAGNOSIS — M542 Cervicalgia: Secondary | ICD-10-CM | POA: Diagnosis not present

## 2022-01-16 DIAGNOSIS — M461 Sacroiliitis, not elsewhere classified: Secondary | ICD-10-CM | POA: Diagnosis not present

## 2022-01-16 DIAGNOSIS — M546 Pain in thoracic spine: Secondary | ICD-10-CM | POA: Diagnosis not present

## 2022-01-16 DIAGNOSIS — M9901 Segmental and somatic dysfunction of cervical region: Secondary | ICD-10-CM | POA: Diagnosis not present

## 2022-01-16 DIAGNOSIS — M9903 Segmental and somatic dysfunction of lumbar region: Secondary | ICD-10-CM | POA: Diagnosis not present

## 2022-01-21 DIAGNOSIS — N6002 Solitary cyst of left breast: Secondary | ICD-10-CM | POA: Diagnosis not present

## 2022-01-21 DIAGNOSIS — R92322 Mammographic fibroglandular density, left breast: Secondary | ICD-10-CM | POA: Diagnosis not present

## 2022-02-01 DIAGNOSIS — M461 Sacroiliitis, not elsewhere classified: Secondary | ICD-10-CM | POA: Diagnosis not present

## 2022-02-01 DIAGNOSIS — M9901 Segmental and somatic dysfunction of cervical region: Secondary | ICD-10-CM | POA: Diagnosis not present

## 2022-02-01 DIAGNOSIS — M545 Low back pain, unspecified: Secondary | ICD-10-CM | POA: Diagnosis not present

## 2022-02-01 DIAGNOSIS — M542 Cervicalgia: Secondary | ICD-10-CM | POA: Diagnosis not present

## 2022-02-01 DIAGNOSIS — M546 Pain in thoracic spine: Secondary | ICD-10-CM | POA: Diagnosis not present

## 2022-02-01 DIAGNOSIS — M9905 Segmental and somatic dysfunction of pelvic region: Secondary | ICD-10-CM | POA: Diagnosis not present

## 2022-02-01 DIAGNOSIS — M9902 Segmental and somatic dysfunction of thoracic region: Secondary | ICD-10-CM | POA: Diagnosis not present

## 2022-02-01 DIAGNOSIS — M9903 Segmental and somatic dysfunction of lumbar region: Secondary | ICD-10-CM | POA: Diagnosis not present

## 2022-02-15 DIAGNOSIS — M461 Sacroiliitis, not elsewhere classified: Secondary | ICD-10-CM | POA: Diagnosis not present

## 2022-02-15 DIAGNOSIS — M546 Pain in thoracic spine: Secondary | ICD-10-CM | POA: Diagnosis not present

## 2022-02-15 DIAGNOSIS — M9901 Segmental and somatic dysfunction of cervical region: Secondary | ICD-10-CM | POA: Diagnosis not present

## 2022-02-15 DIAGNOSIS — M9903 Segmental and somatic dysfunction of lumbar region: Secondary | ICD-10-CM | POA: Diagnosis not present

## 2022-02-15 DIAGNOSIS — M542 Cervicalgia: Secondary | ICD-10-CM | POA: Diagnosis not present

## 2022-02-15 DIAGNOSIS — M9902 Segmental and somatic dysfunction of thoracic region: Secondary | ICD-10-CM | POA: Diagnosis not present

## 2022-02-15 DIAGNOSIS — M545 Low back pain, unspecified: Secondary | ICD-10-CM | POA: Diagnosis not present

## 2022-02-15 DIAGNOSIS — M9905 Segmental and somatic dysfunction of pelvic region: Secondary | ICD-10-CM | POA: Diagnosis not present

## 2022-02-28 DIAGNOSIS — M546 Pain in thoracic spine: Secondary | ICD-10-CM | POA: Diagnosis not present

## 2022-02-28 DIAGNOSIS — M542 Cervicalgia: Secondary | ICD-10-CM | POA: Diagnosis not present

## 2022-02-28 DIAGNOSIS — M9905 Segmental and somatic dysfunction of pelvic region: Secondary | ICD-10-CM | POA: Diagnosis not present

## 2022-02-28 DIAGNOSIS — M545 Low back pain, unspecified: Secondary | ICD-10-CM | POA: Diagnosis not present

## 2022-02-28 DIAGNOSIS — M461 Sacroiliitis, not elsewhere classified: Secondary | ICD-10-CM | POA: Diagnosis not present

## 2022-02-28 DIAGNOSIS — M9903 Segmental and somatic dysfunction of lumbar region: Secondary | ICD-10-CM | POA: Diagnosis not present

## 2022-02-28 DIAGNOSIS — M9902 Segmental and somatic dysfunction of thoracic region: Secondary | ICD-10-CM | POA: Diagnosis not present

## 2022-02-28 DIAGNOSIS — M9901 Segmental and somatic dysfunction of cervical region: Secondary | ICD-10-CM | POA: Diagnosis not present

## 2022-11-09 IMAGING — CT CT ABD-PELV W/O CM
1 of 2 series · 14 of 32 positions shown, 19 images · non-contrast
Comparison: None.

CLINICAL DATA: Right flank pain, hematuria

EXAM:
CT ABDOMEN AND PELVIS WITHOUT CONTRAST
TECHNIQUE: Multidetector CT imaging of the abdomen and pelvis was performed
following the standard protocol without IV contrast.

[Series 2: renal standard/full · axial · 0.96mm/px · z∈[-455,-40]mm · 14 of 95 slices shown, 19 images]
[im 6/95  soft-tissue]
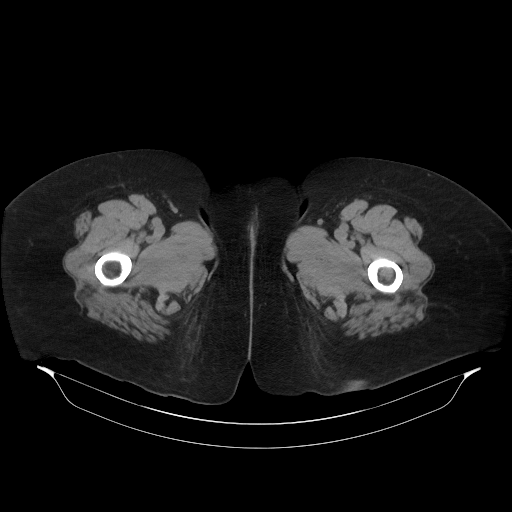
[im 6/95  bone]
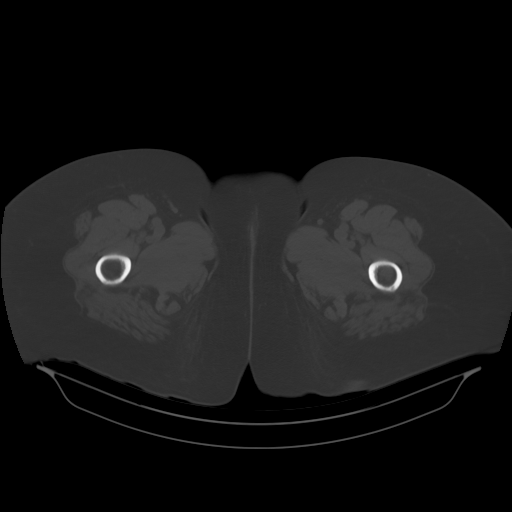
[im 12/95  soft-tissue]
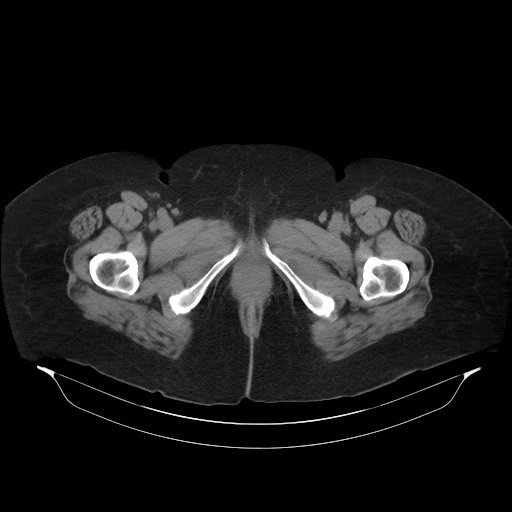
[im 23/95  soft-tissue]
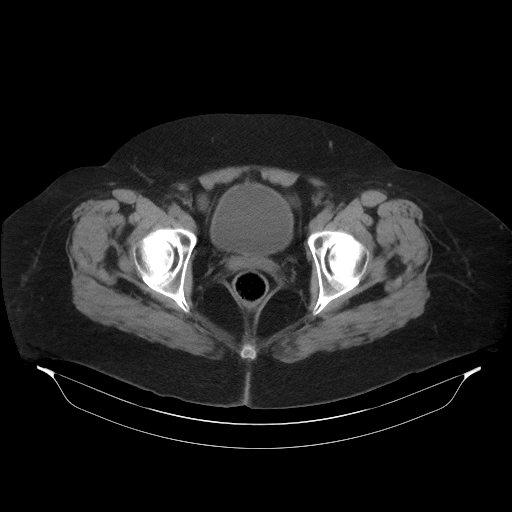
[im 28/95  soft-tissue]
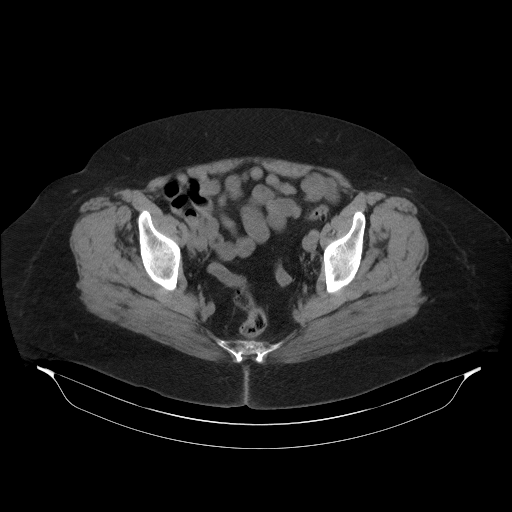
[im 34/95  soft-tissue]
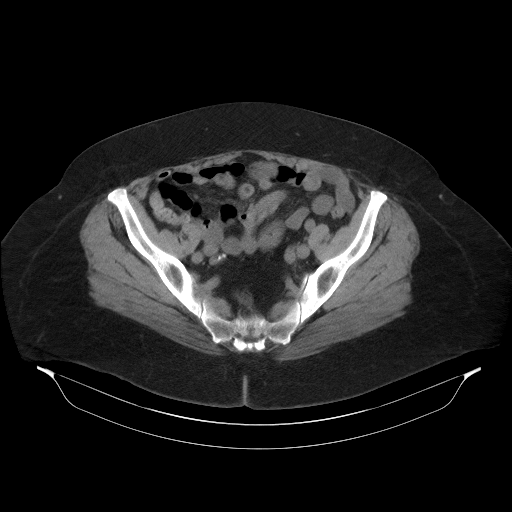
[im 39/95  soft-tissue]
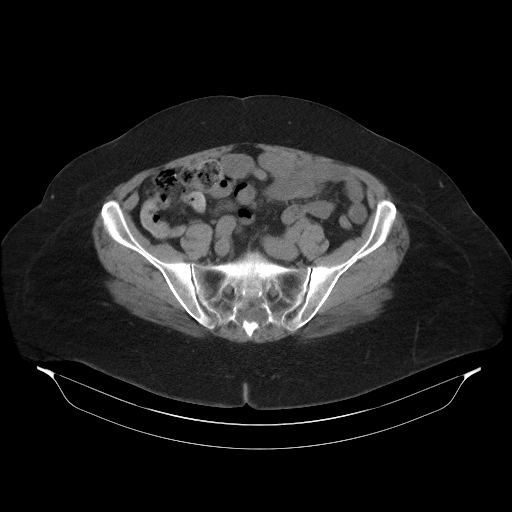
[im 50/95  soft-tissue]
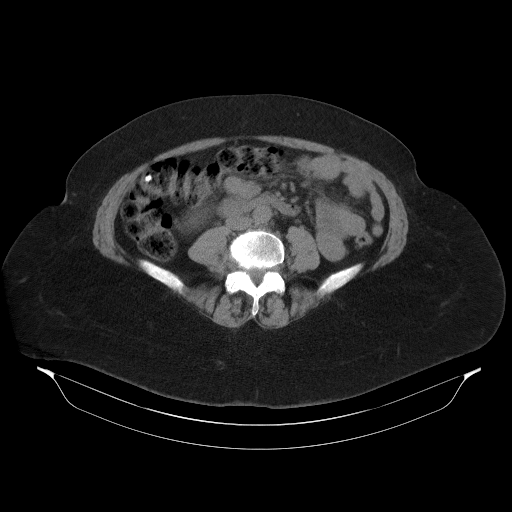
[im 56/95  soft-tissue]
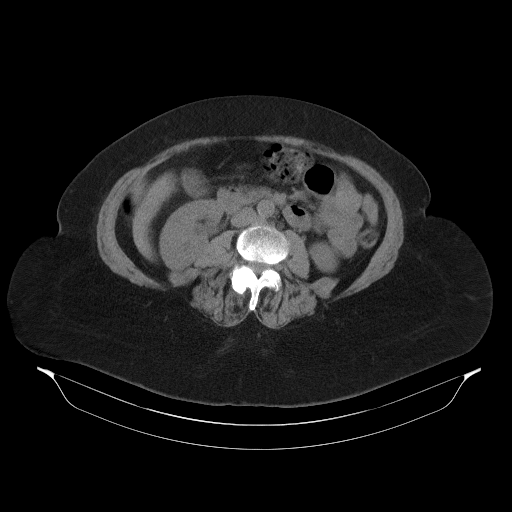
[im 61/95  soft-tissue]
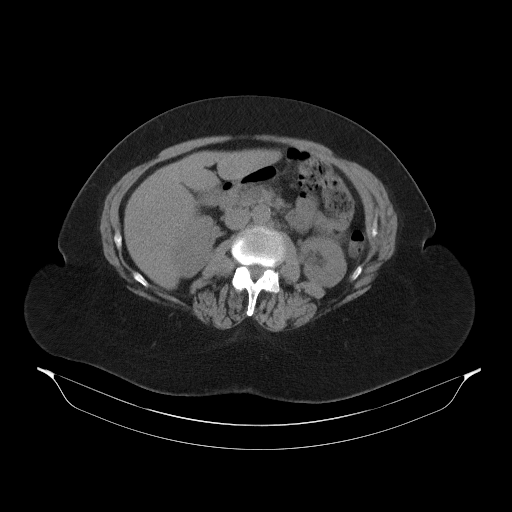
[im 61/95  bone]
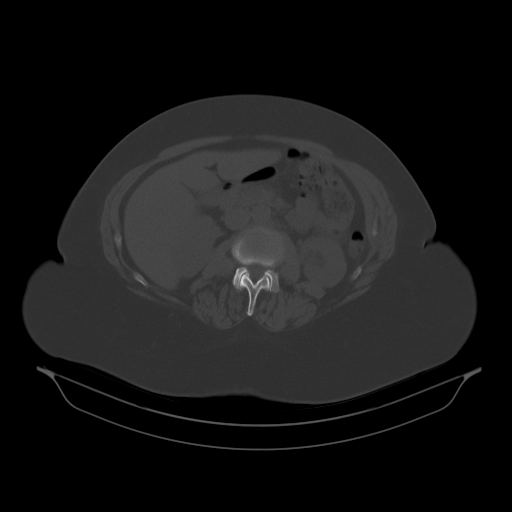
[im 67/95  soft-tissue]
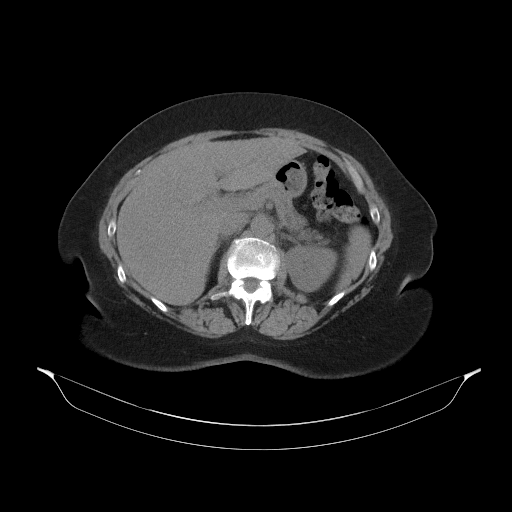
[im 72/95  soft-tissue]
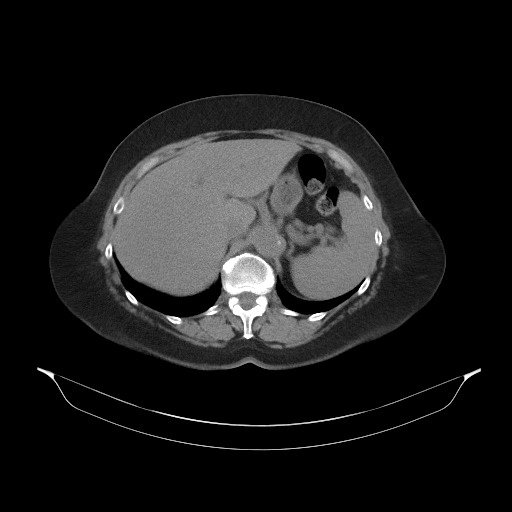
[im 72/95  lung]
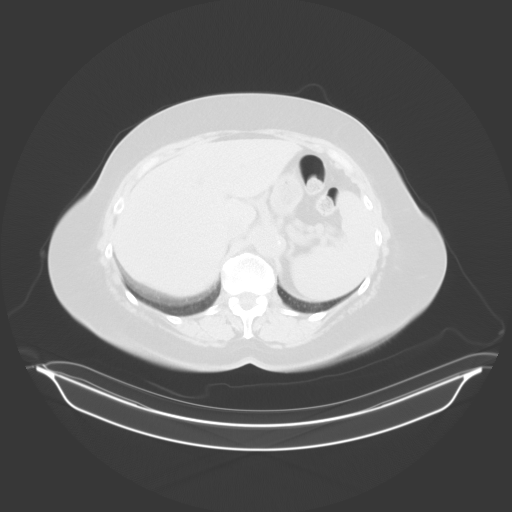
[im 78/95  lung]
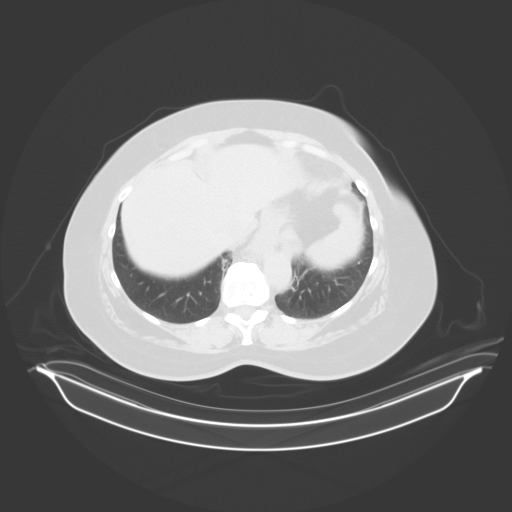
[im 83/95  soft-tissue]
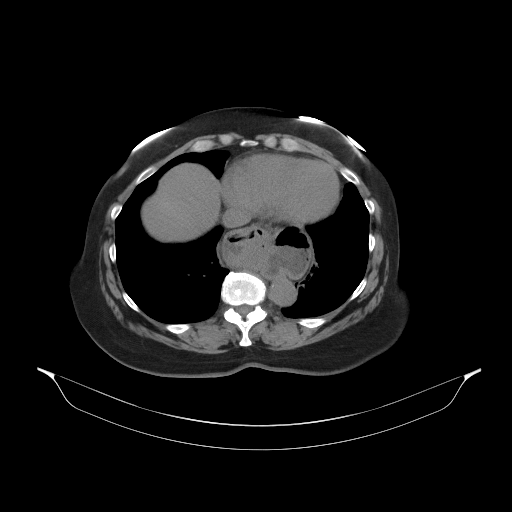
[im 83/95  lung]
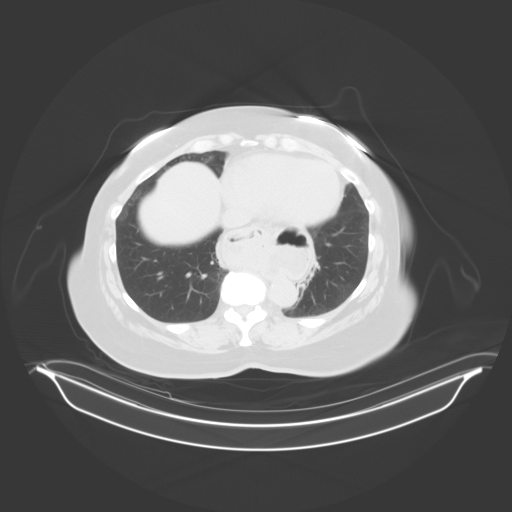
[im 89/95  soft-tissue]
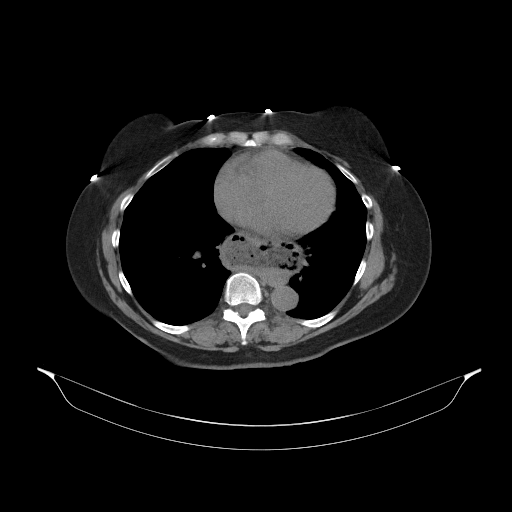
[im 89/95  lung]
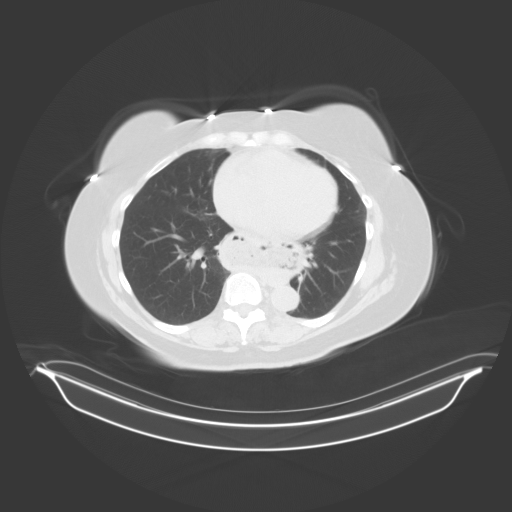

[14 of 32 positions shown; findings below may reference images not displayed]

FINDINGS: Lower chest: Large hiatal hernia. No confluent opacities or
effusions.

Hepatobiliary: No focal hepatic abnormality. Gallbladder
unremarkable.

Pancreas: No focal abnormality or ductal dilatation.

Spleen: No focal abnormality.  Normal size.

Adrenals/Urinary Tract: No adrenal abnormality. No focal renal
abnormality. No stones or hydronephrosis. Urinary bladder is
unremarkable.

Stomach/Bowel: Stomach, large and small bowel grossly unremarkable.

Vascular/Lymphatic: Scattered calcifications. No evidence of
aneurysm or adenopathy.

Reproductive: Prior hysterectomy.  No adnexal masses.

Other: No free fluid or free air.

Musculoskeletal: No acute bony abnormality.
IMPRESSION: No renal or ureteral stones.  No hydronephrosis.

Scattered aortic atherosclerosis.

Large hiatal hernia.

## 2023-04-23 ENCOUNTER — Emergency Department (HOSPITAL_COMMUNITY)
Admission: EM | Admit: 2023-04-23 | Discharge: 2023-04-23 | Disposition: A | Discharge status: Home / Self Care | Payer: Medicare PPO | Attending: Emergency Medicine | Admitting: Emergency Medicine

## 2023-04-23 ENCOUNTER — Encounter (HOSPITAL_COMMUNITY): Payer: Self-pay | Admitting: Emergency Medicine

## 2023-04-23 DIAGNOSIS — Z7982 Long term (current) use of aspirin: Secondary | ICD-10-CM | POA: Insufficient documentation

## 2023-04-23 DIAGNOSIS — R030 Elevated blood-pressure reading, without diagnosis of hypertension: Secondary | ICD-10-CM | POA: Insufficient documentation

## 2023-04-23 DIAGNOSIS — R040 Epistaxis: Secondary | ICD-10-CM | POA: Diagnosis present

## 2023-04-23 LAB — CBC
HCT: 38.9 % (ref 36.0–46.0)
Hemoglobin: 12.6 g/dL (ref 12.0–15.0)
MCH: 28.1 pg (ref 26.0–34.0)
MCHC: 32.4 g/dL (ref 30.0–36.0)
MCV: 86.6 fL (ref 80.0–100.0)
Platelets: 271 10*3/uL (ref 150–400)
RBC: 4.49 MIL/uL (ref 3.87–5.11)
RDW: 14 % (ref 11.5–15.5)
WBC: 9 10*3/uL (ref 4.0–10.5)
nRBC: 0 % (ref 0.0–0.2)

## 2023-04-23 LAB — PROTIME-INR
INR: 1 (ref 0.8–1.2)
Prothrombin Time: 13.3 s (ref 11.4–15.2)

## 2023-04-23 MED ORDER — TRANEXAMIC ACID FOR EPISTAXIS
500.0000 mg | Freq: Once | TOPICAL | Status: AC
  Administered 2023-04-23: 500 mg via TOPICAL
  Filled 2023-04-23 (×2): qty 10

## 2023-04-23 MED ORDER — SILVER NITRATE-POT NITRATE 75-25 % EX MISC
4.0000 | Freq: Once | CUTANEOUS | Status: DC
  Filled 2023-04-23: qty 10

## 2023-04-23 MED ORDER — OXYMETAZOLINE HCL 0.05 % NA SOLN
3.0000 | Freq: Once | NASAL | Status: AC
  Administered 2023-04-23: 3 via NASAL
  Filled 2023-04-23: qty 30
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
## 2023-04-23 NOTE — Discharge Instructions (Addendum)
 It was our pleasure to provide your ER care today - we hope that you feel better.  Avoid rubbing nose, blowing nose, or sneezing for the next couple days.   If bleeding recurs, hold direct pressure/pinch nose for 15 minutes without letting up - if bleeding persists, return to ER.   If recurrent nosebleeds, follow up with ENT doctor in the coming week.  Your blood pressure is high today -  follow up with primary care doctor in the next couple weeks.   Return to ER if worse, new symptoms, persistent/heavy bleeding, weak/faint, or other concern.

## 2023-04-23 NOTE — ED Provider Notes (Signed)
 [place] EMERGENCY DEPARTMENT AT [facility] Provider Note   CSN: [id number] Arrival date & time: 04/23/23  1916     History  Chief Complaint  Patient presents with   Epistaxis    Kaitlyn House is a 68 y.o. female.  Pt indicates was visiting a patient on floor, when had onset nosebleed, bil nares. Denies recent nasal trauma. No uri symptoms. No fever/chills. Denies any other abnormal bruising or bleeding. No anticoagulant use. No faintness. No hx recurrent nosebleeds.   The history is provided by the patient and medical records.       Home Medications Prior to Admission medications   Medication Sig Start Date End Date Taking? Authorizing Provider  Ascorbic Acid (VITAMIN C) 1000 MG tablet Take 1,000 mg by mouth at bedtime.    [provider]  aspirin EC 325 MG tablet Take 1 tablet (325 mg total) by mouth 2 (two) times daily after a meal. 03/09/13   Marshia Ly, PA-C  aspirin-acetaminophen-caffeine (EXCEDRIN MIGRAINE) [phone] MG per tablet Take by mouth every 6 (six) hours as needed for migraine.    [provider]  Calcium Carbonate-Vit D-Min (CALCIUM 600+D PLUS MINERALS) 600-400 MG-UNIT TABS Take 2 tablets by mouth every morning.    [provider]  cetirizine (ZYRTEC) 10 MG tablet Take 10 mg by mouth daily.    [provider]  diphenhydrAMINE (BENADRYL) 25 MG tablet Take 25 mg by mouth every 6 (six) hours as needed for allergies.    [provider]  esomeprazole (NEXIUM) 20 MG capsule Take 20 mg by mouth daily as needed. Acid reflux    [provider]  fesoterodine (TOVIAZ) 4 MG TB24 Take 4 mg by mouth at bedtime.    [provider]  lidocaine (LIDODERM) 5 % Place 1 patch onto the skin daily. Remove & Discard patch within 12 hours or as directed by MD    [provider]  methocarbamol (ROBAXIN-750) 750 MG tablet Take 1 tablet (750 mg total) by mouth every 8 (eight) hours as needed for  muscle spasms. 03/09/13   Marshia Ly, PA-C  oxyCODONE-acetaminophen (PERCOCET/ROXICET) 5-325 MG per tablet Take 1-2 tablets by mouth every 6 (six) hours as needed for severe pain. 03/09/13   Marshia Ly, PA-C  polysaccharide iron (NIFEREX) 150 MG CAPS capsule Take 150 mg by mouth at bedtime.    [provider]  promethazine (PHENERGAN) 25 MG tablet Take 1 tablet (25 mg total) by mouth every 6 (six) hours as needed for nausea. 03/10/13   Marshia Ly, PA-C  pseudoephedrine-guaifenesin [facility] D) 60-600 MG per tablet Take 1 tablet by mouth every 12 (twelve) hours.    [provider]      Allergies    Other, Chocolate, Onion, and Wine [alcohol]    Review of Systems   Review of Systems  Constitutional:  Negative for fever.  HENT:  Negative for sinus pain and sneezing.   Respiratory:  Negative for shortness of breath.   Gastrointestinal:  Negative for blood in stool, nausea and vomiting.  Genitourinary:  Negative for hematuria and vaginal bleeding.  Skin:  Negative for rash.  Neurological:  Negative for light-headedness.  Hematological:  Does not bruise/bleed easily.    Physical Exam Updated Vital Signs BP (!) 159/87 (BP Location: Right Arm)   Pulse (!) 111   Resp 17   SpO2 97%  Physical Exam Vitals and nursing note reviewed.  Constitutional:      Appearance: Normal appearance. She is  well-developed.  HENT:     Head: Atraumatic.     Nose:     Comments: Fresh blood bil nares. No brisk bleeding.     Mouth/Throat:     Mouth: Mucous membranes are moist.     Pharynx: Oropharynx is clear.  Eyes:     General: No scleral icterus.    Conjunctiva/sclera: Conjunctivae normal.  Neck:     Trachea: No tracheal deviation.     Comments: Trachea midline, no neck mass/swelling.  Cardiovascular:     Rate and Rhythm: Normal rate.     Pulses: Normal pulses.  Pulmonary:     Effort: Pulmonary effort is normal. No respiratory distress.  Musculoskeletal:        General: No  swelling.     Cervical back: Normal range of motion and neck supple. No rigidity. No muscular tenderness.  Skin:    General: Skin is warm and dry.     Findings: No bruising or rash.     Comments: No petechia or rash or bruising  Neurological:     Mental Status: She is alert.     Comments: Alert, speech normal.   Psychiatric:        Mood and Affect: Mood normal.     ED Results / Procedures / Treatments   Labs (all labs ordered are listed, but only abnormal results are displayed) Results for orders placed or performed during the hospital encounter of 04/23/23  CBC   Collection Time: 04/23/23  8:28 PM  Result Value Ref Range   WBC 9.0 4.0 - 10.5 K/uL   RBC 4.49 3.87 - 5.11 MIL/uL   Hemoglobin 12.6 12.0 - 15.0 g/dL   HCT 16.1 09.6 - 04.5 %   MCV 86.6 80.0 - 100.0 fL   MCH 28.1 26.0 - 34.0 pg   MCHC 32.4 30.0 - 36.0 g/dL   RDW 40.9 81.1 - 91.4 %   Platelets 271 150 - 400 K/uL   nRBC 0.0 0.0 - 0.2 %  Protime-INR   Collection Time: 04/23/23  8:28 PM  Result Value Ref Range   Prothrombin Time 13.3 11.4 - 15.2 seconds   INR 1.0 0.8 - 1.2     EKG None  Radiology No results found.  Procedures Epistaxis Management  Date/Time: 04/23/2023 9:29 PM  Performed by: Cathren Laine, MD Authorized by: Cathren Laine, MD   Consent:    Consent given by:  Patient Procedure details:    Treatment site:  L anterior and R anterior   Repair method: cotton ball w afrin/txa, packing. Post-procedure details:    Assessment:  Bleeding stopped   Procedure completion:  Tolerated well, no immediate complications Comments:     After cotton ball packing w txa/afrin, pressure - packing removed.  Silver nitrate used anterior septum, small area slow oozing. Observed. Reassessed. No persistent bleeding anteriorly or posteriorly.      Medications Ordered in ED Medications  silver nitrate applicators applicator 4 Stick (has no administration in time range)  oxymetazoline (AFRIN) 0.05 % nasal  spray 3 spray (3 sprays Each Nare Given 04/23/23 2124)  tranexamic acid (CYKLOKAPRON) 1000 MG/10ML topical solution 500 mg (500 mg Topical Given 04/23/23 2124)    ED Course/ Medical Decision Making/ A&P                                 Medical Decision Making Problems Addressed: Anterior epistaxis: acute illness or injury with systemic  symptoms that poses a threat to life or bodily functions Elevated blood pressure reading: acute illness or injury with systemic symptoms that poses a threat to life or bodily functions  Amount and/or Complexity of Data Reviewed Independent Historian:     Details: Family, hx External Data Reviewed: notes. Labs: ordered. Decision-making details documented in ED Course.  Risk OTC drugs. Prescription drug management.   Labs sent.   Pressure held for 15 minutes.   Reviewed nursing notes and prior charts for additional history.   Labs reviewed/interpreted by me - wbc and hgb normal. Inr normal.   Recheck two additional times, no bleeding anteriorly or posteriorly.   Pt currently appears stable for d/c. Vitals normal. No bleeding.   Rec pcp and ent f/u.  Return precautions provided.           Final Clinical Impression(s) / ED Diagnoses Final diagnoses:  Anterior epistaxis  Elevated blood pressure reading    Rx / DC Orders ED Discharge Orders     None         Cathren Laine, MD 04/23/23 2249

## 2023-04-28 ENCOUNTER — Emergency Department (HOSPITAL_BASED_OUTPATIENT_CLINIC_OR_DEPARTMENT_OTHER)
Admission: EM | Admit: 2023-04-28 | Discharge: 2023-04-29 | Disposition: A | Discharge status: Home / Self Care | Payer: Medicare PPO | Attending: Emergency Medicine | Admitting: Emergency Medicine

## 2023-04-28 ENCOUNTER — Encounter (HOSPITAL_BASED_OUTPATIENT_CLINIC_OR_DEPARTMENT_OTHER): Payer: Self-pay | Admitting: Emergency Medicine

## 2023-04-28 ENCOUNTER — Emergency Department (HOSPITAL_COMMUNITY)
Admission: EM | Admit: 2023-04-28 | Discharge: 2023-04-28 | Discharge status: Against Medical Advice | Payer: Medicare PPO

## 2023-04-28 DIAGNOSIS — R040 Epistaxis: Secondary | ICD-10-CM | POA: Insufficient documentation

## 2023-04-28 DIAGNOSIS — Z87891 Personal history of nicotine dependence: Secondary | ICD-10-CM | POA: Diagnosis not present

## 2023-04-28 MED ORDER — OXYMETAZOLINE HCL 0.05 % NA SOLN
1.0000 | Freq: Once | NASAL | Status: AC
  Administered 2023-04-28: 1 via NASAL
  Filled 2023-04-28: qty 30
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
## 2023-04-28 NOTE — ED Triage Notes (Signed)
 Nose bleed. Started around 7:30 pm Was come out of tear ducts  Had another nose bleed on 04/23/23  No blood thinner

## 2023-04-28 NOTE — ED Notes (Signed)
 ED Provider at bedside.

## 2023-04-28 NOTE — ED Notes (Signed)
 Pt states she is leaving because we taking long to triage her she was only in the waiting room for 10 min. Pt left

## 2023-04-29 ENCOUNTER — Other Ambulatory Visit: Payer: Self-pay

## 2023-04-29 MED ORDER — NAPROXEN 500 MG PO TABS: 500.0000 mg | ORAL_TABLET | Freq: Two times a day (BID) | ORAL | 0 refills | Status: AC

## 2023-04-29 MED ORDER — KETOROLAC TROMETHAMINE 15 MG/ML IJ SOLN
15.0000 mg | Freq: Once | INTRAMUSCULAR | Status: AC
  Administered 2023-04-29: 15 mg via INTRAMUSCULAR
  Filled 2023-04-29: qty 1

## 2023-04-29 MED ORDER — OXYCODONE HCL 5 MG PO TABS: 5.0000 mg | ORAL_TABLET | Freq: Once | ORAL | Status: DC

## 2023-04-29 NOTE — Discharge Instructions (Signed)
 You were evaluated in the Emergency Department and after careful evaluation, we did not find any emergent condition requiring admission or further testing in the hospital.  Your exam/testing today is overall reassuring.  Recommend following up with Dr. Suszanne Conners the ENT specialist for further management of your nosebleeds.  Recommend calling the office number later this morning to make a specific appointment time.  Recommend Tylenol and Motrin for discomfort regarding your ear pain.  Please return to the Emergency Department if you experience any worsening of your condition.   Thank you for allowing [place] to be a part of your care.
